# Patient Record
Sex: Female | Born: 1935 | Race: Black or African American | Hispanic: No | Smoking: Never smoker
Health system: Southern US, Community
[De-identification: ages and names within clinical notes are randomized; demographics above are authoritative.]

## PROBLEM LIST (undated history)

## (undated) DIAGNOSIS — E119 Type 2 diabetes mellitus without complications: Secondary | ICD-10-CM

## (undated) DIAGNOSIS — I10 Essential (primary) hypertension: Secondary | ICD-10-CM

## (undated) DIAGNOSIS — H548 Legal blindness, as defined in USA: Secondary | ICD-10-CM

## (undated) HISTORY — PX: MASTECTOMY: SHX3

---

## 2001-09-02 ENCOUNTER — Encounter: Payer: Self-pay | Admitting: General Surgery

## 2001-09-02 ENCOUNTER — Inpatient Hospital Stay (HOSPITAL_COMMUNITY): Admission: EM | Admit: 2001-09-02 | Discharge: 2001-09-04 | Payer: Self-pay | Admitting: Emergency Medicine

## 2001-09-02 ENCOUNTER — Encounter: Payer: Self-pay | Admitting: Emergency Medicine

## 2001-09-02 ENCOUNTER — Encounter (INDEPENDENT_AMBULATORY_CARE_PROVIDER_SITE_OTHER): Payer: Self-pay | Admitting: Specialist

## 2001-09-03 ENCOUNTER — Encounter: Payer: Self-pay | Admitting: General Surgery

## 2007-08-02 ENCOUNTER — Encounter: Admission: RE | Admit: 2007-08-02 | Discharge: 2007-08-02 | Payer: Self-pay | Admitting: General Surgery

## 2007-09-05 ENCOUNTER — Encounter (INDEPENDENT_AMBULATORY_CARE_PROVIDER_SITE_OTHER): Payer: Self-pay | Admitting: General Surgery

## 2007-09-05 ENCOUNTER — Ambulatory Visit (HOSPITAL_COMMUNITY): Admission: RE | Admit: 2007-09-05 | Discharge: 2007-09-06 | Payer: Self-pay | Admitting: General Surgery

## 2007-09-12 ENCOUNTER — Ambulatory Visit: Payer: Self-pay | Admitting: Oncology

## 2007-09-26 LAB — COMPREHENSIVE METABOLIC PANEL
AST: 13 U/L (ref 0–37)
Albumin: 3.9 g/dL (ref 3.5–5.2)
BUN: 21 mg/dL (ref 6–23)
Calcium: 9.1 mg/dL (ref 8.4–10.5)
Chloride: 98 mEq/L (ref 96–112)
Glucose, Bld: 242 mg/dL — ABNORMAL HIGH (ref 70–99)
Potassium: 4.5 mEq/L (ref 3.5–5.3)

## 2007-09-26 LAB — CBC WITH DIFFERENTIAL/PLATELET
Basophils Absolute: 0 10*3/uL (ref 0.0–0.1)
EOS%: 3.3 % (ref 0.0–7.0)
Eosinophils Absolute: 0.2 10*3/uL (ref 0.0–0.5)
HGB: 13.2 g/dL (ref 11.6–15.9)
NEUT#: 4.4 10*3/uL (ref 1.5–6.5)
RDW: 13.8 % (ref 11.3–14.5)
WBC: 6.5 10*3/uL (ref 3.9–10.0)
lymph#: 0.9 10*3/uL (ref 0.9–3.3)

## 2007-09-26 LAB — CANCER ANTIGEN 27.29: CA 27.29: 9 U/mL (ref 0–39)

## 2007-09-27 LAB — VITAMIN D 25 HYDROXY (VIT D DEFICIENCY, FRACTURES): Vit D, 25-Hydroxy: 72 ng/mL (ref 30–89)

## 2007-10-01 LAB — VITAMIN D 1,25 DIHYDROXY: Vit D, 1,25-Dihydroxy: 61 pg/mL (ref 6–62)

## 2007-12-19 ENCOUNTER — Ambulatory Visit: Payer: Self-pay | Admitting: Oncology

## 2008-01-17 LAB — COMPREHENSIVE METABOLIC PANEL
Albumin: 4 g/dL (ref 3.5–5.2)
BUN: 21 mg/dL (ref 6–23)
Calcium: 9.3 mg/dL (ref 8.4–10.5)
Chloride: 101 mEq/L (ref 96–112)
Glucose, Bld: 235 mg/dL — ABNORMAL HIGH (ref 70–99)
Potassium: 4.3 mEq/L (ref 3.5–5.3)

## 2008-01-17 LAB — CBC WITH DIFFERENTIAL/PLATELET
Basophils Absolute: 0 10*3/uL (ref 0.0–0.1)
Eosinophils Absolute: 0.1 10*3/uL (ref 0.0–0.5)
HCT: 37.3 % (ref 34.8–46.6)
HGB: 12.6 g/dL (ref 11.6–15.9)
MCV: 86.8 fL (ref 81.0–101.0)
NEUT#: 4.7 10*3/uL (ref 1.5–6.5)
RDW: 14 % (ref 11.3–14.5)
lymph#: 2 10*3/uL (ref 0.9–3.3)

## 2008-07-22 ENCOUNTER — Ambulatory Visit: Payer: Self-pay | Admitting: Oncology

## 2008-07-24 LAB — CBC WITH DIFFERENTIAL/PLATELET
Basophils Absolute: 0 10*3/uL (ref 0.0–0.1)
EOS%: 2.1 % (ref 0.0–7.0)
Eosinophils Absolute: 0.1 10*3/uL (ref 0.0–0.5)
HCT: 36.5 % (ref 34.8–46.6)
HGB: 12.3 g/dL (ref 11.6–15.9)
MCH: 29.4 pg (ref 26.0–34.0)
MCV: 87.1 fL (ref 81.0–101.0)
MONO%: 8.9 % (ref 0.0–13.0)
NEUT#: 4.5 10*3/uL (ref 1.5–6.5)
NEUT%: 65.1 % (ref 39.6–76.8)
Platelets: 332 10*3/uL (ref 145–400)

## 2008-07-25 LAB — COMPREHENSIVE METABOLIC PANEL
AST: 9 U/L (ref 0–37)
Albumin: 4 g/dL (ref 3.5–5.2)
Alkaline Phosphatase: 75 U/L (ref 39–117)
BUN: 18 mg/dL (ref 6–23)
Calcium: 9.3 mg/dL (ref 8.4–10.5)
Creatinine, Ser: 1.08 mg/dL (ref 0.40–1.20)
Glucose, Bld: 218 mg/dL — ABNORMAL HIGH (ref 70–99)
Potassium: 4.5 mEq/L (ref 3.5–5.3)

## 2008-07-25 LAB — VITAMIN D 25 HYDROXY (VIT D DEFICIENCY, FRACTURES): Vit D, 25-Hydroxy: 41 ng/mL (ref 30–89)

## 2008-07-25 LAB — LACTATE DEHYDROGENASE: LDH: 130 U/L (ref 94–250)

## 2009-01-07 ENCOUNTER — Ambulatory Visit: Payer: Self-pay | Admitting: Oncology

## 2009-01-09 LAB — CBC WITH DIFFERENTIAL/PLATELET
Basophils Absolute: 0 10*3/uL (ref 0.0–0.1)
EOS%: 1.8 % (ref 0.0–7.0)
Eosinophils Absolute: 0.1 10*3/uL (ref 0.0–0.5)
HGB: 12.4 g/dL (ref 11.6–15.9)
MCH: 29 pg (ref 25.1–34.0)
NEUT#: 3.6 10*3/uL (ref 1.5–6.5)
RDW: 14.5 % (ref 11.2–14.5)
lymph#: 1 10*3/uL (ref 0.9–3.3)

## 2009-01-12 LAB — COMPREHENSIVE METABOLIC PANEL
AST: 13 U/L (ref 0–37)
Albumin: 3.9 g/dL (ref 3.5–5.2)
BUN: 27 mg/dL — ABNORMAL HIGH (ref 6–23)
Calcium: 9.4 mg/dL (ref 8.4–10.5)
Chloride: 104 mEq/L (ref 96–112)
Potassium: 4.3 mEq/L (ref 3.5–5.3)
Total Protein: 6.8 g/dL (ref 6.0–8.3)

## 2009-01-12 LAB — VITAMIN D 25 HYDROXY (VIT D DEFICIENCY, FRACTURES): Vit D, 25-Hydroxy: 28 ng/mL — ABNORMAL LOW (ref 30–89)

## 2009-07-17 ENCOUNTER — Ambulatory Visit: Payer: Self-pay | Admitting: Oncology

## 2009-07-21 LAB — CBC WITH DIFFERENTIAL/PLATELET
Basophils Absolute: 0 10*3/uL (ref 0.0–0.1)
Eosinophils Absolute: 0.1 10*3/uL (ref 0.0–0.5)
HGB: 11.9 g/dL (ref 11.6–15.9)
MCV: 87.9 fL (ref 79.5–101.0)
MONO#: 0.6 10*3/uL (ref 0.1–0.9)
MONO%: 10.7 % (ref 0.0–14.0)
NEUT#: 3.8 10*3/uL (ref 1.5–6.5)
RDW: 15.2 % — ABNORMAL HIGH (ref 11.2–14.5)
WBC: 5.9 10*3/uL (ref 3.9–10.3)

## 2009-07-22 LAB — COMPREHENSIVE METABOLIC PANEL
Albumin: 3.7 g/dL (ref 3.5–5.2)
Alkaline Phosphatase: 84 U/L (ref 39–117)
BUN: 27 mg/dL — ABNORMAL HIGH (ref 6–23)
CO2: 23 mEq/L (ref 19–32)
Calcium: 9.1 mg/dL (ref 8.4–10.5)
Chloride: 104 mEq/L (ref 96–112)
Glucose, Bld: 221 mg/dL — ABNORMAL HIGH (ref 70–99)
Potassium: 4.4 mEq/L (ref 3.5–5.3)
Total Protein: 6 g/dL (ref 6.0–8.3)

## 2009-07-22 LAB — LACTATE DEHYDROGENASE: LDH: 102 U/L (ref 94–250)

## 2009-07-22 LAB — VITAMIN D 25 HYDROXY (VIT D DEFICIENCY, FRACTURES): Vit D, 25-Hydroxy: 34 ng/mL (ref 30–89)

## 2010-01-06 ENCOUNTER — Ambulatory Visit: Payer: Self-pay | Admitting: Oncology

## 2010-01-14 LAB — CBC WITH DIFFERENTIAL/PLATELET
BASO%: 0.3 % (ref 0.0–2.0)
Basophils Absolute: 0 10*3/uL (ref 0.0–0.1)
EOS%: 1.9 % (ref 0.0–7.0)
Eosinophils Absolute: 0.1 10*3/uL (ref 0.0–0.5)
HCT: 36.5 % (ref 34.8–46.6)
HGB: 12.4 g/dL (ref 11.6–15.9)
LYMPH%: 27.2 % (ref 14.0–49.7)
MCH: 30.2 pg (ref 25.1–34.0)
MCHC: 33.8 g/dL (ref 31.5–36.0)
MCV: 89.1 fL (ref 79.5–101.0)
MONO#: 0.5 10*3/uL (ref 0.1–0.9)
MONO%: 8.1 % (ref 0.0–14.0)
NEUT#: 4.2 10*3/uL (ref 1.5–6.5)
NEUT%: 62.5 % (ref 38.4–76.8)
Platelets: 365 10*3/uL (ref 145–400)
RBC: 4.1 10*6/uL (ref 3.70–5.45)
RDW: 13.8 % (ref 11.2–14.5)
WBC: 6.7 10*3/uL (ref 3.9–10.3)
lymph#: 1.8 10*3/uL (ref 0.9–3.3)

## 2010-01-15 LAB — COMPREHENSIVE METABOLIC PANEL
ALT: 9 U/L (ref 0–35)
AST: 11 U/L (ref 0–37)
Albumin: 4 g/dL (ref 3.5–5.2)
Alkaline Phosphatase: 75 U/L (ref 39–117)
BUN: 20 mg/dL (ref 6–23)
CO2: 23 mEq/L (ref 19–32)
Calcium: 9.1 mg/dL (ref 8.4–10.5)
Chloride: 102 mEq/L (ref 96–112)
Creatinine, Ser: 1.14 mg/dL (ref 0.40–1.20)
Glucose, Bld: 213 mg/dL — ABNORMAL HIGH (ref 70–99)
Potassium: 4.6 mEq/L (ref 3.5–5.3)
Sodium: 137 mEq/L (ref 135–145)
Total Bilirubin: 0.4 mg/dL (ref 0.3–1.2)
Total Protein: 6.5 g/dL (ref 6.0–8.3)

## 2010-01-15 LAB — CANCER ANTIGEN 27.29: CA 27.29: 13 U/mL (ref 0–39)

## 2010-01-15 LAB — VITAMIN D 25 HYDROXY (VIT D DEFICIENCY, FRACTURES): Vit D, 25-Hydroxy: 34 ng/mL (ref 30–89)

## 2010-07-09 ENCOUNTER — Ambulatory Visit: Payer: Self-pay | Admitting: Oncology

## 2010-07-16 LAB — CBC WITH DIFFERENTIAL/PLATELET
BASO%: 0.6 % (ref 0.0–2.0)
Basophils Absolute: 0 10*3/uL (ref 0.0–0.1)
EOS%: 1.8 % (ref 0.0–7.0)
HCT: 37 % (ref 34.8–46.6)
HGB: 12.5 g/dL (ref 11.6–15.9)
MCH: 29.6 pg (ref 25.1–34.0)
MONO#: 0.7 10*3/uL (ref 0.1–0.9)
RDW: 14.4 % (ref 11.2–14.5)
WBC: 6.7 10*3/uL (ref 3.9–10.3)
lymph#: 1.7 10*3/uL (ref 0.9–3.3)

## 2010-07-16 LAB — COMPREHENSIVE METABOLIC PANEL
ALT: 14 U/L (ref 0–35)
AST: 14 U/L (ref 0–37)
Albumin: 4.1 g/dL (ref 3.5–5.2)
BUN: 30 mg/dL — ABNORMAL HIGH (ref 6–23)
CO2: 25 mEq/L (ref 19–32)
Calcium: 9.5 mg/dL (ref 8.4–10.5)
Chloride: 105 mEq/L (ref 96–112)
Potassium: 4.4 mEq/L (ref 3.5–5.3)

## 2010-07-16 LAB — LACTATE DEHYDROGENASE: LDH: 120 U/L (ref 94–250)

## 2010-08-25 ENCOUNTER — Ambulatory Visit (HOSPITAL_BASED_OUTPATIENT_CLINIC_OR_DEPARTMENT_OTHER): Payer: Medicare Other | Admitting: Oncology

## 2011-01-04 NOTE — Op Note (Signed)
Debbie Singleton, Debbie Singleton               ACCOUNT NO.:  [id number]   MEDICAL RECORD NO.:  [id number]          PATIENT TYPE:  AMB   LOCATION:  SDS                          FACILITY:  [facility]   PHYSICIAN:  Angelia Mould. Derrell Lolling, M.D.DATE OF BIRTH:  07/19/1926   DATE OF PROCEDURE:  09/05/2007  DATE OF DISCHARGE:                               OPERATIVE REPORT   PREOPERATIVE DIAGNOSIS:  Extensive ductal carcinoma in situ, left  breast.   POSTOPERATIVE DIAGNOSIS:  Extensive ductal carcinoma in situ, left  breast.   OPERATION PERFORMED:  1. Injection blue dye left breast.  2. Left total mastectomy.  3. Sentinel node biopsy.   SURGEON:  Angelia Mould. Derrell Lolling, M.D.   ASSISTANT:  Currie Paris, M.D.   OPERATIVE INDICATIONS:  This is a 77 year old black female who felt a  lump in her breast for about 3 months and went for x-rays.  She has had  mammograms, ultrasound, BSGI and MRI.  What is found is about a 7 cm  mass in the left breast in the upper inner quadrant.  This has lobulated  margins.  Endoscopic biopsy showed intermediate grade ductal carcinoma  in situ.  We discussed her options.  Decision was made to have a left  total mastectomy and left sentinel node biopsy.  She is brought to the  hospital electively.   OPERATIVE TECHNIQUE:  The patient underwent injection of 1 mCi of  technetium sulfur colloid by the nuclear medicine technician in the  holding area of the OR.  She was then taken to operating room.  General  anesthesia was induced.  Intravenous antibiotics were given.  The  patient was identified as the correct patient, correct procedure and  correct site.  Following alcohol prep,  I injected 5 mL of blue dye  behind the right nipple.  This was 2 mL of methylene blue and 3 mL of  saline.  We massaged the breast for about 5 minutes.  The neck and  chest, left breast, axilla, and left lateral chest wall were then  prepped and draped in sterile fashion.   I carefully marked  transverse elliptical incision so as to remove  adequate amounts of skin and avoid skin redundancy.  This transverse  elliptical incision was made with a knife.  Skin flaps were raised  superiorly to just below the clavicle, medially to the parasternal area,  inferiorly down to the rectus sheath, and laterally to latissimus dorsi  muscle.  Using the NeoProbe, we dissected out a single hot blue lymph  node that was probably 150 to 200 times background.  This was the only  lymph node that we could find that had radioactivity.  We sent this to  the lab to Dr. Colonel Bald who said that imprint cytology was negative for  cancer cells.  There was some other enlarged lymph nodes so I excised a  small area of these lymph nodes for routine histology.  This was mostly  a small area of level I lymph nodes.  The breast was then dissected away  from the pectoralis major muscle with electrocautery, taking the  breast  and pectoralis fascia within the bleeding.  All of the pectoralis major  and minor muscle intact.  The tail of Mliss Sax was marked with a suture.  The breast was sent the lab for routine histology.  The wound was  irrigated copiously with saline.  Hemostasis was excellent and achieved  electrocautery.  Two 19-French Blake drains were placed, one up into the  left axillary space and one across the skin flaps.  These were brought  out through separate stab incisions inferolaterally, sutured to the skin  with nylon suture and connected to suction bulbs.  Skin was closed  with skin staples.  Kling bandages placed with Adaptic gauze, 4x4s, ABD  pads, and 6 inch Ace  wraps.  She tolerated the procedure well and was  taken to the recovery room  in stable condition.  Estimated blood loss  was about 75-100 mL.  There were no complications.  Sponge, needle and  instrument counts were correct.      Angelia Mould. Derrell Lolling, M.D.  Electronically Signed     HMI/MEDQ  D:  09/05/2007  T:  09/05/2007  Job:   [id number]   cc:   Merlene Laughter. Renae Gloss, M.D.

## 2011-01-07 NOTE — H&P (Signed)
[facility]  Patient:    Debbie Singleton, Debbie Singleton Visit Number: [id number] MRN: [id number]          Service Type: EMS Location: ED Attending Physician:  Donnetta Hutching Dictated by:   Angelia Mould. Derrell Lolling, M.D. Admit Date:  09/02/2001   CC:         Geraldo Pitter, M.D.   History and Physical  CHIEF COMPLAINT:  Abdominal pain and vomiting.  HISTORY OF PRESENT ILLNESS:  This is a 77 year old black female with type 2 diabetes.  She presents with a 36-hour history of epigastric pain, right upper quadrant pain, and right back pain.  She vomited three times.  Her bowel movements have been slightly softer than usual, but no diarrhea or significant constipation.  She has seen no blood in her stool.  She saw Harrel Lemon. Merla Riches, M.D., at [facility] yesterday and was thought to have "food poisoning."  She was given Phenergan.  The pain and nausea persisted and she came to the [facility] Emergency Room today and was evaluated by the emergency department physician and was found to have gallstones.  I was called to see her at that point.  The patient states that she has had mild epigastric discomfort in the past, but denies relationship to meals and denies fatty food intolerance.  She denies a history of liver disease, jaundice, or hepatitis.  She denies a history of ulcer disease, diverticulitis, or appendicitis.  She has no heart or pulmonary problems.  She is admitted for management of her acute cholecystitis.  PAST MEDICAL HISTORY:  She has had six pregnancies and six vaginal deliveries. She had a bilateral tubal ligation and possibly an appendectomy after the last pregnancy.  She has type 2 diabetes.  She has borderline hypertension.  She states that she has problems with her eyesight related to glaucoma.  She has tried lots of different drops, was allergic to all, and is not taking any now. She is followed by Remi Deter L. Chales Abrahams, M.D., who is her  ophthalmologist.  CURRENT MEDICATIONS:  Glynase one tablet daily.  DRUG ALLERGIES:  None known.  SOCIAL HISTORY:  The patient lives in [place], [place], with her daughter.  She denies the use of alcohol or tobacco.  FAMILY HISTORY:  Her mother died in her 30s of hypertension and cerebral hemorrhage.  Her father died in his 60s of sudden death.  She had four sisters and two brothers, three have died, but she does not know why.  REVIEW OF SYSTEMS:  All systems are reviewed and are noncontributory, except as described above.  PHYSICAL EXAMINATION:  A pleasant, somewhat overweight, black female in minimal distress.  She has received some parenteral analgesics and is slightly sedated, but is cooperative and conversant and oriented.  VITAL SIGNS:  Temperature 97.1 degrees, pulse 68 and regular, respirations 18, blood pressure 179/60.  HEENT:  The sclerae are clear.  Extraocular movements are intact.  She has very poor eyesight.  She cannot track my fingers and cannot count fingers. She does have good light perception and states that she can see body shapes and movement.  Oropharynx clear.  NECK:  Supple.  Nontender.  No mass.  No bruit.  No jugular venous distention. No adenopathy.  No tenderness.  No crepitants.  LUNGS:  Clear to auscultation.  No CVA tenderness.  HEART:  Regular rate and rhythm.  No murmurs.  ABDOMEN:  Tender in the right upper quadrant, but no guarding, no rebound, and  no mass.  She is not distended.  Bowel sounds are hypoactive.  She has a well-healed lower midline scar, presumably from her tubal ligation.  She is somewhat obese.  EXTREMITIES:  No edema.  Good pulses in the feet.  NEUROLOGIC:  Grossly within normal limits.  ADMISSION LABORATORY DATA:  Ultrasound shows gallstones, thickening of the gallbladder wall, and the common bile duct measures 7.0 mm.  Serum amylase 43.  Liver function tests normal.  Glucose 237. Hemoglobin 13.8, white  blood cell count 8400.  IMPRESSION: 1. Acute cholecystitis with cholelithiasis. 2. Type 2 diabetes mellitus. 3. Borderline hypertension. 4. Severe visual impairment reportedly secondary to glaucoma.  PLAN: 1. The patient will be admitted to the hospital and started on IV antibiotics    and bowel rest. 2. We will place her on a perioperative diabetic sliding scale insulin    protocol. 3. She will undergo cholecystectomy tomorrow.  I have explained the indications and details of surgery to the patient and her daughter.  The risks and complications have been discussed, including, but not limited to bleeding, infection, conversion to open laparotomy, bile duct injury, injury to adjacent organs, wound problems such as hernia or infection, and other unforeseen problems.  She seems to understand all of these issues well and at this time all of her questions were answered.  She agreed to this plan. Dictated by:   Angelia Mould. Derrell Lolling, M.D. Attending Physician:  Donnetta Hutching DD:  09/02/01 TD:  09/02/01 Job: [id number]

## 2011-03-09 ENCOUNTER — Encounter: Payer: Medicare Other | Admitting: Oncology

## 2011-03-09 DIAGNOSIS — Z170 Estrogen receptor positive status [ER+]: Secondary | ICD-10-CM

## 2011-03-09 DIAGNOSIS — C50219 Malignant neoplasm of upper-inner quadrant of unspecified female breast: Secondary | ICD-10-CM

## 2011-03-09 LAB — CBC WITH DIFFERENTIAL/PLATELET
Basophils Absolute: 0 10*3/uL (ref 0.0–0.1)
EOS%: 1.9 % (ref 0.0–7.0)
HCT: 42 % (ref 34.8–46.6)
HGB: 13.9 g/dL (ref 11.6–15.9)
MCH: 28.8 pg (ref 25.1–34.0)
MCV: 86.9 fL (ref 79.5–101.0)
MONO%: 9.3 % (ref 0.0–14.0)
NEUT%: 55.5 % (ref 38.4–76.8)
Platelets: 340 10*3/uL (ref 145–400)

## 2011-03-09 LAB — COMPREHENSIVE METABOLIC PANEL
AST: 15 U/L (ref 0–37)
Alkaline Phosphatase: 111 U/L (ref 39–117)
BUN: 22 mg/dL (ref 6–23)
Calcium: 10.1 mg/dL (ref 8.4–10.5)
Creatinine, Ser: 1.1 mg/dL (ref 0.50–1.10)

## 2011-03-21 ENCOUNTER — Encounter (HOSPITAL_BASED_OUTPATIENT_CLINIC_OR_DEPARTMENT_OTHER): Payer: Medicare Other | Admitting: Oncology

## 2011-03-21 DIAGNOSIS — Z170 Estrogen receptor positive status [ER+]: Secondary | ICD-10-CM

## 2011-03-21 DIAGNOSIS — C50219 Malignant neoplasm of upper-inner quadrant of unspecified female breast: Secondary | ICD-10-CM

## 2011-05-12 LAB — BASIC METABOLIC PANEL
Calcium: 9.8
GFR calc Af Amer: >60
GFR calc non Af Amer: 52 — ABNORMAL LOW
Glucose, Bld: 159 — ABNORMAL HIGH
Potassium: 4.1
Sodium: 132 — ABNORMAL LOW

## 2011-05-12 LAB — URINALYSIS, ROUTINE W REFLEX MICROSCOPIC
Glucose, UA: NEGATIVE
Hgb urine dipstick: NEGATIVE
Ketones, ur: NEGATIVE
Protein, ur: NEGATIVE

## 2011-05-12 LAB — CBC
HCT: 39.6
Hemoglobin: 13.4
RDW: 14.5
WBC: 7.5

## 2011-05-12 LAB — DIFFERENTIAL
Basophils Absolute: 0.1
Lymphocytes Relative: 28
Lymphs Abs: 2.1
Monocytes Absolute: 0.7
Neutro Abs: 4.5

## 2011-05-12 LAB — URINE MICROSCOPIC-ADD ON

## 2011-07-28 ENCOUNTER — Telehealth: Payer: Self-pay | Admitting: *Deleted

## 2011-07-28 NOTE — Telephone Encounter (Signed)
called patient to inform the patient of the new date and time the patient's phone line had been disconnected mailed out letter and calendar to inform the patient of the new date and time

## 2011-10-11 ENCOUNTER — Other Ambulatory Visit: Payer: Medicare Other | Admitting: Lab

## 2011-10-14 ENCOUNTER — Telehealth: Payer: Self-pay | Admitting: Oncology

## 2011-10-14 NOTE — Telephone Encounter (Signed)
Unable to contact theb phone by telephone. Phone has been disconnected

## 2011-10-19 ENCOUNTER — Ambulatory Visit: Payer: Medicare Other | Admitting: Physician Assistant

## 2011-10-19 ENCOUNTER — Ambulatory Visit: Payer: Medicare Other | Admitting: Oncology

## 2013-10-09 LAB — CBC WITH DIFFERENTIAL/PLATELET
BASOS ABS: 0 10*3/uL (ref 0.0–0.1)
Basophil %: 0.6 %
Eosinophil #: 0.1 10*3/uL (ref 0.0–0.7)
Eosinophil %: 0.7 %
HCT: 34.6 % — ABNORMAL LOW (ref 35.0–47.0)
HGB: 11.9 g/dL — AB (ref 12.0–16.0)
Lymphocyte #: 1.1 10*3/uL (ref 1.0–3.6)
Lymphocyte %: 14.7 %
MCH: 30.6 pg (ref 26.0–34.0)
MCHC: 34.4 g/dL (ref 32.0–36.0)
MCV: 89 fL (ref 80–100)
Monocyte #: 0.7 x10 3/mm (ref 0.2–0.9)
Monocyte %: 8.5 %
NEUTROS ABS: 5.8 10*3/uL (ref 1.4–6.5)
Neutrophil %: 75.5 %
Platelet: 348 10*3/uL (ref 150–440)
RBC: 3.9 10*6/uL (ref 3.80–5.20)
RDW: 14.3 % (ref 11.5–14.5)
WBC: 7.7 10*3/uL (ref 3.6–11.0)

## 2013-10-10 ENCOUNTER — Inpatient Hospital Stay: Payer: Self-pay | Admitting: Internal Medicine

## 2013-10-10 LAB — OSMOLALITY, URINE: OSMOLALITY: 495 mosm/kg

## 2013-10-10 LAB — URINALYSIS, COMPLETE
BACTERIA: NONE SEEN
Bilirubin,UR: NEGATIVE
Blood: NEGATIVE
Glucose,UR: >=500 mg/dL (ref 0–75)
LEUKOCYTE ESTERASE: NEGATIVE
Nitrite: NEGATIVE
Ph: 5 (ref 4.5–8.0)
Protein: NEGATIVE
Specific Gravity: 1.011 (ref 1.003–1.030)
Squamous Epithelial: <1
WBC UR: 1 /HPF (ref 0–5)

## 2013-10-10 LAB — BASIC METABOLIC PANEL
Anion Gap: 9 (ref 7–16)
BUN: 14 mg/dL (ref 7–18)
CHLORIDE: 86 mmol/L — AB (ref 98–107)
CREATININE: 1.02 mg/dL (ref 0.60–1.30)
Calcium, Total: 8.5 mg/dL (ref 8.5–10.1)
Co2: 22 mmol/L (ref 21–32)
EGFR (African American): 58 — ABNORMAL LOW
EGFR (Non-African Amer.): 50 — ABNORMAL LOW
Glucose: 231 mg/dL — ABNORMAL HIGH (ref 65–99)
Osmolality: 244 (ref 275–301)
Potassium: 4.1 mmol/L (ref 3.5–5.1)
Sodium: 117 mmol/L — CL (ref 136–145)

## 2013-10-10 LAB — SODIUM, URINE, RANDOM: SODIUM, URINE RANDOM: 102 mmol/L (ref 20–110)

## 2013-10-10 LAB — SEDIMENTATION RATE: Erythrocyte Sed Rate: 21 mm/hr (ref 0–30)

## 2013-10-10 LAB — OSMOLALITY: Osmolality: 249 mOsm/kg — CL (ref 280–301)

## 2013-10-10 LAB — TROPONIN I: Troponin-I: <0.02 ng/mL

## 2013-10-11 LAB — BASIC METABOLIC PANEL
ANION GAP: 6 — AB (ref 7–16)
BUN: 14 mg/dL (ref 7–18)
CALCIUM: 8 mg/dL — AB (ref 8.5–10.1)
Chloride: 106 mmol/L (ref 98–107)
Co2: 23 mmol/L (ref 21–32)
Creatinine: 0.92 mg/dL (ref 0.60–1.30)
EGFR (African American): >60
GFR CALC NON AF AMER: 56 — AB
Glucose: 129 mg/dL — ABNORMAL HIGH (ref 65–99)
OSMOLALITY: 272 (ref 275–301)
POTASSIUM: 4.1 mmol/L (ref 3.5–5.1)
Sodium: 135 mmol/L — ABNORMAL LOW (ref 136–145)

## 2013-10-11 LAB — LIPID PANEL
CHOLESTEROL: 144 mg/dL (ref 0–200)
HDL: 51 mg/dL (ref 40–60)
LDL CHOLESTEROL, CALC: 75 mg/dL (ref 0–100)
TRIGLYCERIDES: 91 mg/dL (ref 0–200)
VLDL Cholesterol, Calc: 18 mg/dL (ref 5–40)

## 2013-10-11 LAB — TSH: Thyroid Stimulating Horm: 4.32 u[IU]/mL

## 2014-10-09 DIAGNOSIS — E559 Vitamin D deficiency, unspecified: Secondary | ICD-10-CM | POA: Diagnosis not present

## 2014-10-09 DIAGNOSIS — I129 Hypertensive chronic kidney disease with stage 1 through stage 4 chronic kidney disease, or unspecified chronic kidney disease: Secondary | ICD-10-CM | POA: Diagnosis not present

## 2014-10-09 DIAGNOSIS — E1122 Type 2 diabetes mellitus with diabetic chronic kidney disease: Secondary | ICD-10-CM | POA: Diagnosis not present

## 2014-12-13 NOTE — Discharge Summary (Signed)
PATIENT NAME:  Debbie Singleton, Debbie Singleton MR#:  [id number] DATE OF BIRTH:  11/08/1924  DATE OF ADMISSION:  10/10/2013 DATE OF DISCHARGE:  10/11/2013  PRESENTING COMPLAINT: Loss of vision.   DISCHARGE DIAGNOSES: 1.  Progressive loss of vision, acute on chronic, due to combination of glaucoma and acuity degeneration.  2.  Hyponatremia due to blood pressure meds and poor p.o. intake, resolved.  3.  Type 2 diabetes.  4.  Hypertension.   CODE STATUS: Full code.   DISCHARGE MEDICATIONS: 1.  Atenolol 25 mg p.o. daily.  2.  Latanoprost 0.005% ophthalmic drops 1 drop to affected eyes at bedtime.  3.  Glimepiride 2 mg daily.  4.  Azilsartan 40 mg p.o. daily.  5.  The patient advised not to stop taking edarbychlor.   FOLLOWUP: With Dr. George Ina at [facility] in 2 to 4 weeks. Follow up with your primary care physician in 1 to 2 weeks.   LABORATORY, DIAGNOSTIC AND RADIOLOGICAL DATA:   1.  Sodium on admission was 117, at discharge it was 135. Lipid profile within normal limits. TSH within normal limits.  2.  Echo of the heart showed EF of 55% to 60%, no cardiac thrombi seen. Mild-to-moderate tricuspid regurgitation, mild to moderate aortic regurgitation.  3.  UA negative for UTI. 4.  CBC within normal limits except H and H of 11.9 and 34.6.   CONSULTATIONS:  Ophthalmology consultation with Dr. George Ina.   BRIEF SUMMARY OF HOSPITAL COURSE: Debbie Singleton is an 81 year old African American female with history of glaucoma and macular degeneration and loss of vision and history of hypertension, who comes in with:  1.  Progressive increasing loss of vision. The patient seems to have an underlying diagnosis of glaucoma and macular degeneration per ophthalmology eval. No further recommendations were given by Dr. George Ina other than follow up with [facility] in 1 to 2 months. The patient seems to have this as a chronic issue. She has not been using her eye drops as directed and has been using her  homemade eyedrops which she made from distilled water, honey and vinegar. The patient was advised to stop using her homemade eyedrops and asked her to resume her eyedrops prescribed by the ophthalmologist.  2.  Hyponatremia secondary to low salt diet which the patient follows and blood pressure meds, which is ARB with chlorthalidone that was recently started in January. The patient received IV fluids. Her sodium is back to 135. She is asymptomatic.  3.  Type 2 diabetes. Continue glimepiride.   Overall hospital stay remained stable. The patient will be discharged home with her daughter.   TIME SPENT: 40 minutes.   ____________________________ Hart Rochester Posey Pronto, MD sap:cs D: 10/13/2013 07:20:00 ET T: 10/13/2013 14:28:01 ET JOB#: [id number]  cc: Jvon Meroney A. Posey Pronto, MD, <Dictator> Livingston Diones. Porfilio, MD Ilda Basset MD ELECTRONICALLY SIGNED 10/20/2013 13:38

## 2014-12-13 NOTE — H&P (Signed)
PATIENT NAME:  Debbie Singleton, ZEE MR#:  [id number] DATE OF BIRTH:  Jun 04, 1925  DATE OF ADMISSION:  10/10/2013  PRIMARY CARE PHYSICIAN:  Nonlocal.   REFERRING PHYSICIAN:  Dr. Owens Shark.   CHIEF COMPLAINT:  Loss of vision in the left eye.   HISTORY OF PRESENT ILLNESS:  Debbie Singleton is an 81 year old female who is an extremely poor historian who comes to the Emergency Department with complaints of loss of vision in the left eye.  The patient follows up with an ophthalmologist in [place].  Per the patient, the patient has been having high pressures in her eye, seems to be like a glaucoma.  However, the patient does not use the eye drops prescribed by the ophthalmologist.  The patient uses her won eye drops prepared by her with vinegar, honey and distilled water.  The patient put her eye drops around 4 in the afternoon with sudden loss of vision.  At baseline the patient only can see shapes.  Currently the patient states only can see some light.  Concerning this, the patient is brought to the Emergency Department.  Work-up in the Emergency Department, CT head which was unremarkable.  Sodium level was 117.  The patient does not remember what medications she takes for her blood pressure.  States she takes a pill with a combination of two medications.  Denies having any headache.  Denies having any pain in the eye.  Denies having any watery eyes.  Discussed with ophthalmology by the Emergency Department physician who recommended the patient to come and follow up as an outpatient.    PAST MEDICAL HISTORY:   1.  Per patient, diabetes mellitus.  2.  Hypertension.   PAST SURGICAL HISTORY:  1.  Tubal ligation.  2.  Cholecystectomy.  3.  Left breast mastectomy.   ALLERGIES:  No known drug allergies.   HOME MEDICATIONS:  The patient is unable to recall.   SOCIAL HISTORY:  Denies smoking, drinking alcohol or using illicit drugs.  Lives with her grandson.   FAMILY HISTORY:  Per patient, no obvious health problems  run in the family.   REVIEW OF SYSTEMS: CONSTITUTIONAL:  Experiences generalized weakness.  EYES:  Has blurred vision which is her baseline.  EARS, NOSE, THROAT:  No change in hearing.  RESPIRATORY:  Has no cough, shortness of breath.  CARDIOVASCULAR:  No chest pain, palpitations.  GASTROINTESTINAL:  No nausea, vomiting, abdominal pain. GENITOURINARY:  No dysuria or hematuria.  ENDOCRINE:  Has diagnosis of diabetes mellitus.  HEMATOLOGY:  No easy bruising or bleeding.  SKIN:  No rash or lesions.  MUSCULOSKELETAL:  No joint pains.  NEUROLOGIC:  No weakness or numbness in any part of the body.   PHYSICAL EXAMINATION:  GENERAL:  This is a well-built, well-nourished, age-appropriate female lying down in the bed, not in distress.  VITAL SIGNS:  Temperature is 98.7, pulse 73, blood pressure 117/71, respiratory rate of 20, oxygen saturation 100% on room air.  HEENT:  Head normocephalic, atraumatic.  Eyes, no scleral icterus.  Conjunctivae normal.  Pupils unequal, right has irregular pupil, poor response to the light.  Mucous membranes moist.  NECK:  Supple.  No lymphadenopathy.  No JVD.  No carotid bruit.  CHEST:  Has no focal tenderness.   LUNGS:  Bilateral clear to auscultation.  HEART:  S1, S2 regular.  No murmurs are heard.  ABDOMEN:  Bowel sounds plus.  Soft, nontender, nondistended.  No hepatosplenomegaly.  EXTREMITIES:  No pedal edema.  Pulses 2+.  NEUROLOGIC:  The patient is alert, oriented to place, person and time.  Cranial nerves II through XII intact.  Motor 5 by 5 in upper and lower extremities.   LABORATORY DATA:  CBC is completely within normal limits.  CMP, glucose 231, sodium 117, the rest of all the values are within normal limits.  CT head without contrast:  No acute intracranial abnormality.  The troponin less than 0.02.  EKG, 12-lead:  Normal sinus rhythm.  No ST-T wave abnormalities.   ASSESSMENT AND PLAN:  Debbie Singleton is an 81 year old female who comes to the Emergency  Department with sudden loss of vision in the left eye.  1.  Acute loss of vision.  Differential diagnosis, the patient seems to have underlying diagnosis of glaucoma, central retinal artery or venous occlusion.  Consult ophthalmology in the morning, concerning about the patient's complex history.  The patient seems to be having this chronic issue and has been noncompliant with her medications given by her ophthalmologist.  2.  Hyponatremia.  The patient states takes a pill with a combination of pills, most likely hydrochlorothiazide, however ask the patient's family to bring the medications.  Continue with IV fluids.  Check the urine sodium, urine and serum osmolality.  Follow up with a BMP q. 12 hours.  3.  Diabetes mellitus.  Keep the patient on Accu-Cheks and sliding scale insulin.  4.  Hypertension, currently well-controlled.  We will hold all the blood pressure medications.  5.  Keep the patient on deep vein thrombosis prophylaxis with Lovenox.   TIME SPENT:  50 minutes.    ____________________________ Monica Becton, MD pv:ea D: 10/10/2013 25:63:89 ET T: 10/10/2013 02:34:28 ET JOB#: [id number]  cc: Monica Becton, MD, <Dictator> Monica Becton MD ELECTRONICALLY SIGNED 10/24/2013 1:23

## 2015-02-08 IMAGING — CT CT HEAD WITHOUT CONTRAST
1 series · 16 of 30 positions shown, 20 images · non-contrast
Comparison: None.

CLINICAL DATA: Vision loss, nausea, vomiting.

EXAM:
CT HEAD WITHOUT CONTRAST
TECHNIQUE: Contiguous axial images were obtained from the base of the skull
through the vertex without intravenous contrast.

[Series 2: head wo · axial · 0.39mm/px · z∈[-45,+81]mm · 16 of 32 slices shown, 20 images]
[im 2/32  brain]
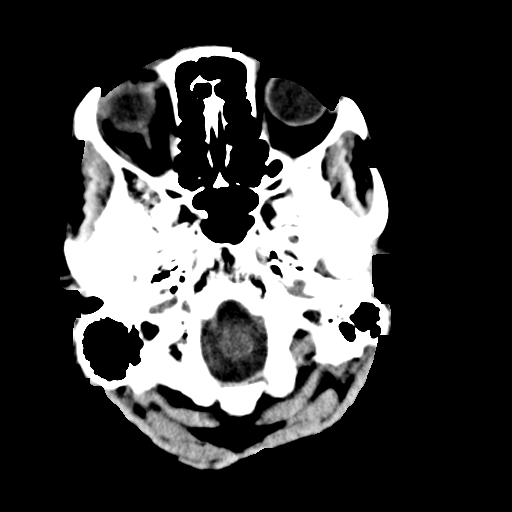
[im 2/32  bone]
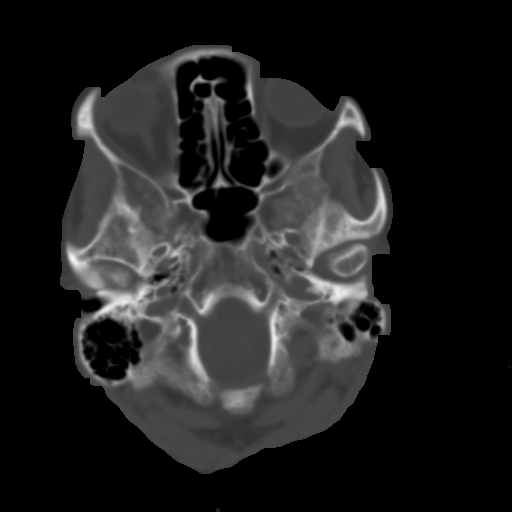
[im 4/32  brain]
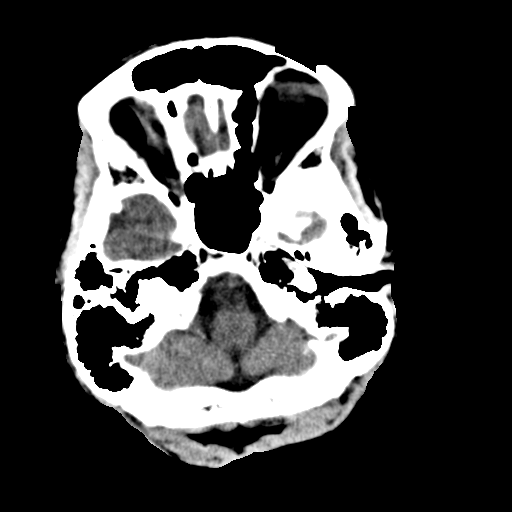
[im 6/32  brain]
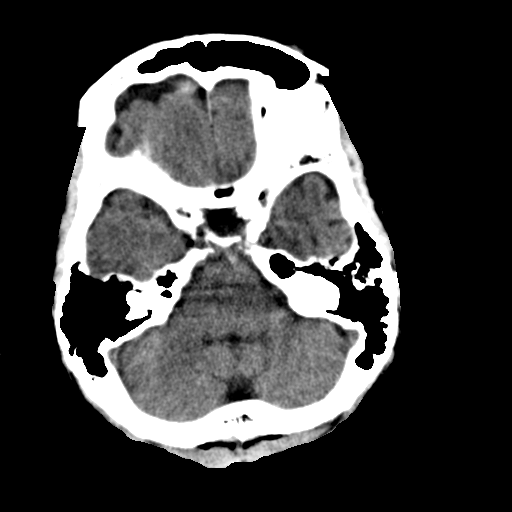
[im 8/32  brain]
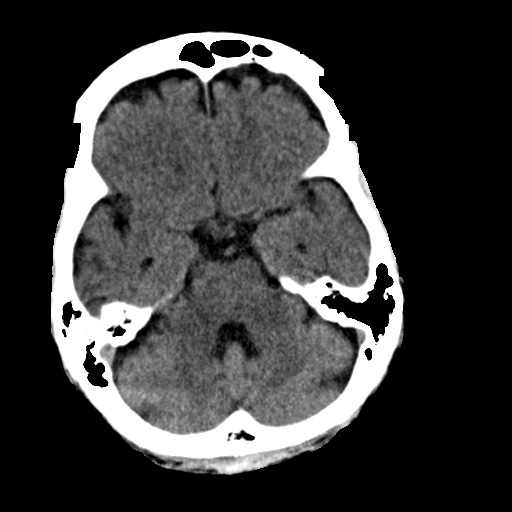
[im 9/32  brain]
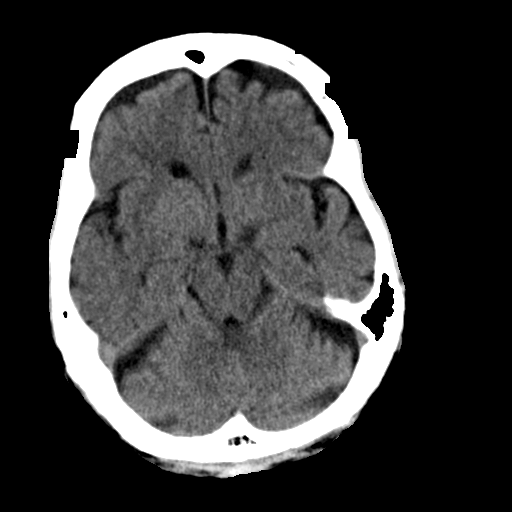
[im 9/32  bone]
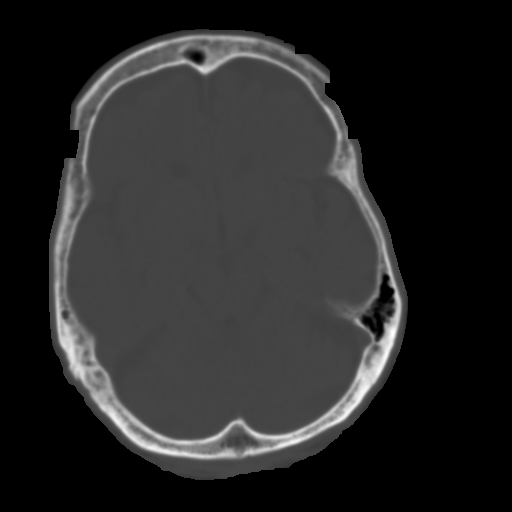
[im 11/32  brain]
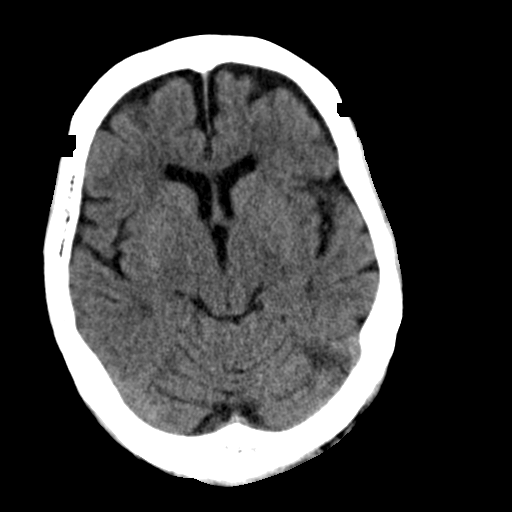
[im 13/32  brain]
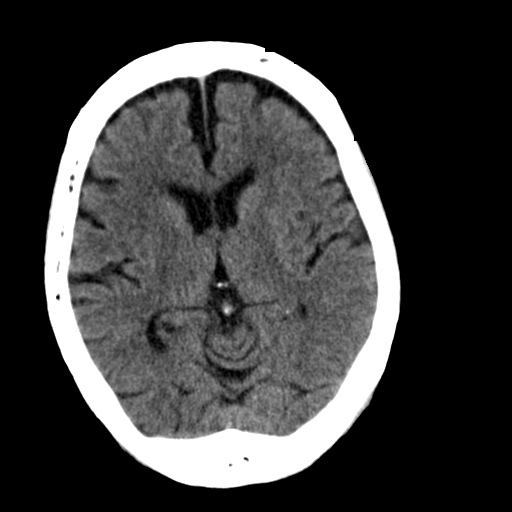
[im 15/32  brain]
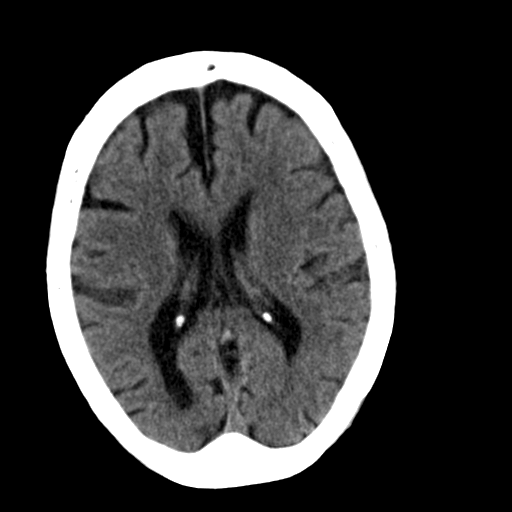
[im 17/32  brain]
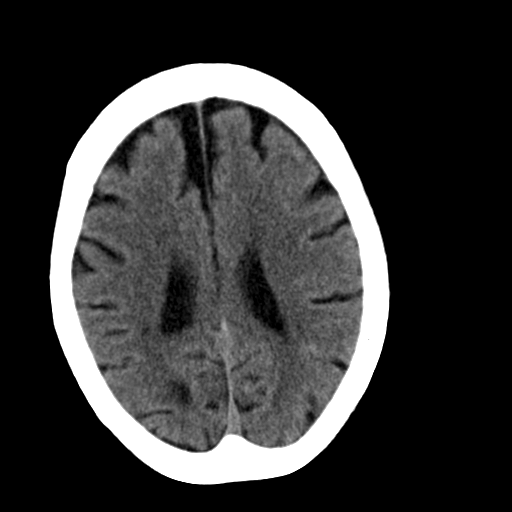
[im 17/32  bone]
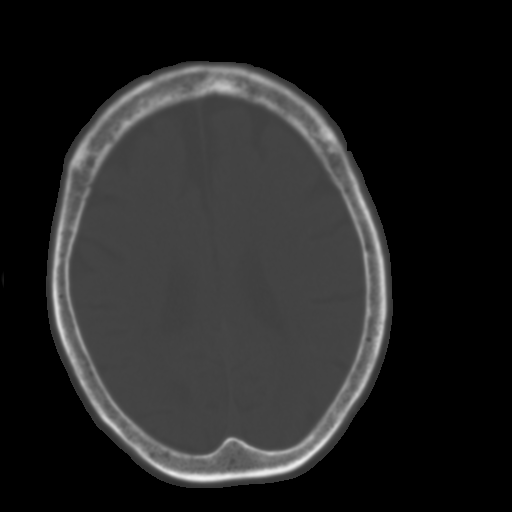
[im 19/32  brain]
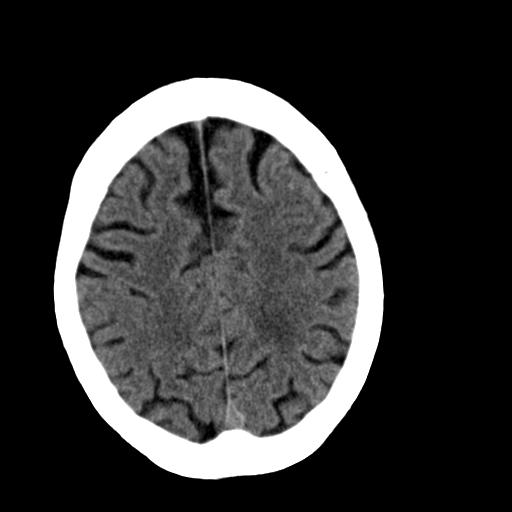
[im 21/32  brain]
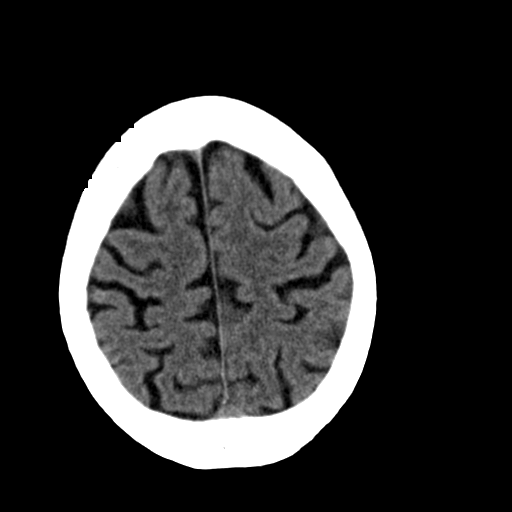
[im 23/32  brain]
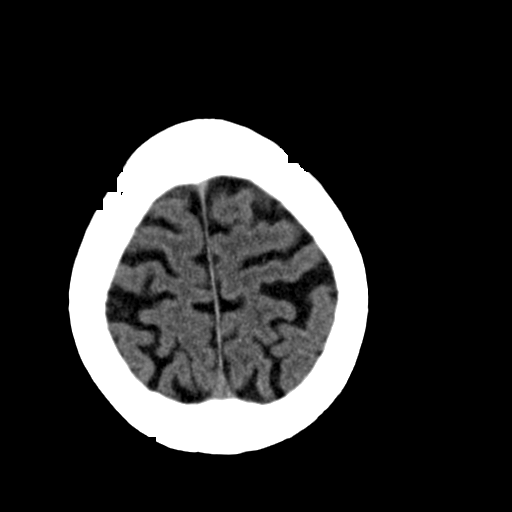
[im 24/32  brain]
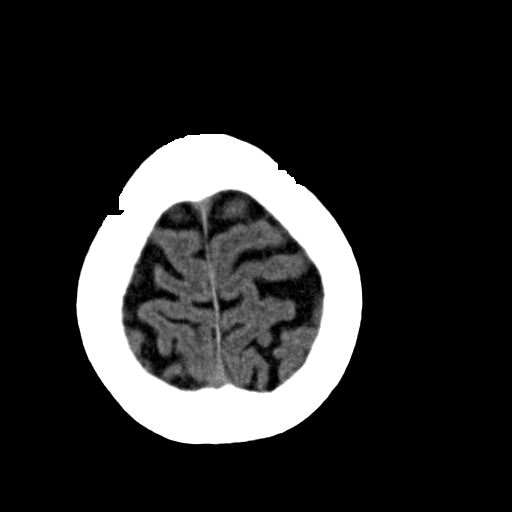
[im 24/32  bone]
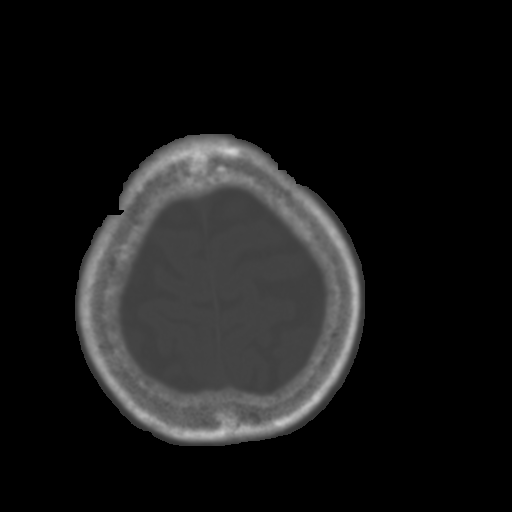
[im 26/32  brain]
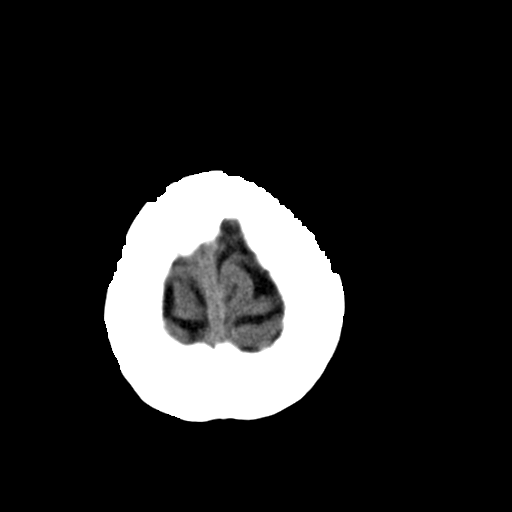
[im 28/32  brain]
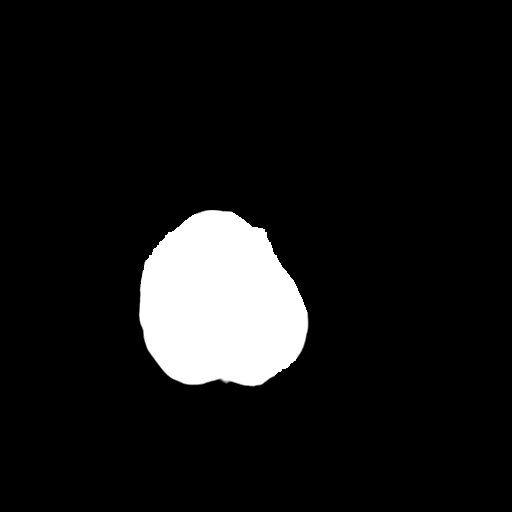
[im 30/32  brain]
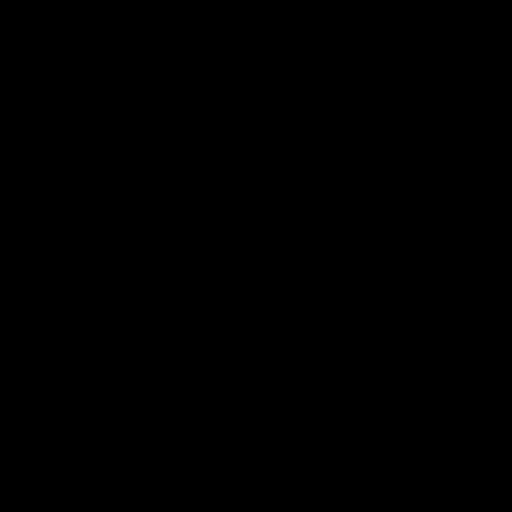

[16 of 30 positions shown; findings below may reference images not displayed]

FINDINGS: Mild age related volume loss. Minimal chronic microvascular changes
throughout the deep white matter. No acute intracranial abnormality.
Specifically, no hemorrhage, hydrocephalus, mass lesion, acute
infarction, or significant intracranial injury. No acute calvarial
abnormality. Visualized paranasal sinuses and mastoids clear.
Orbital soft tissues unremarkable.
IMPRESSION: No acute intracranial abnormality.

## 2018-10-03 DIAGNOSIS — R600 Localized edema: Secondary | ICD-10-CM | POA: Diagnosis present

## 2018-10-16 DIAGNOSIS — I10 Essential (primary) hypertension: Secondary | ICD-10-CM | POA: Diagnosis present

## 2018-10-16 DIAGNOSIS — E119 Type 2 diabetes mellitus without complications: Secondary | ICD-10-CM

## 2020-08-05 ENCOUNTER — Emergency Department: Payer: Medicare Other

## 2020-08-05 ENCOUNTER — Other Ambulatory Visit: Payer: Self-pay

## 2020-08-05 ENCOUNTER — Observation Stay
Admission: EM | Admit: 2020-08-05 | Discharge: 2020-08-07 | Disposition: A | Discharge status: Home / Self Care | Payer: Medicare Other | Attending: Internal Medicine | Admitting: Internal Medicine

## 2020-08-05 ENCOUNTER — Encounter: Payer: Self-pay | Admitting: Intensive Care

## 2020-08-05 DIAGNOSIS — R600 Localized edema: Secondary | ICD-10-CM | POA: Insufficient documentation

## 2020-08-05 DIAGNOSIS — R4182 Altered mental status, unspecified: Secondary | ICD-10-CM

## 2020-08-05 DIAGNOSIS — X58XXXA Exposure to other specified factors, initial encounter: Secondary | ICD-10-CM | POA: Diagnosis not present

## 2020-08-05 DIAGNOSIS — U071 COVID-19: Secondary | ICD-10-CM | POA: Diagnosis not present

## 2020-08-05 DIAGNOSIS — B9689 Other specified bacterial agents as the cause of diseases classified elsewhere: Secondary | ICD-10-CM | POA: Diagnosis not present

## 2020-08-05 DIAGNOSIS — R609 Edema, unspecified: Secondary | ICD-10-CM

## 2020-08-05 DIAGNOSIS — I10 Essential (primary) hypertension: Secondary | ICD-10-CM | POA: Diagnosis not present

## 2020-08-05 DIAGNOSIS — E119 Type 2 diabetes mellitus without complications: Secondary | ICD-10-CM | POA: Diagnosis not present

## 2020-08-05 DIAGNOSIS — S6292XD Unspecified fracture of left wrist and hand, subsequent encounter for fracture with routine healing: Secondary | ICD-10-CM

## 2020-08-05 DIAGNOSIS — G9341 Metabolic encephalopathy: Secondary | ICD-10-CM | POA: Diagnosis not present

## 2020-08-05 DIAGNOSIS — M7989 Other specified soft tissue disorders: Secondary | ICD-10-CM | POA: Diagnosis present

## 2020-08-05 DIAGNOSIS — N10 Acute tubulo-interstitial nephritis: Secondary | ICD-10-CM

## 2020-08-05 DIAGNOSIS — S6292XA Unspecified fracture of left wrist and hand, initial encounter for closed fracture: Secondary | ICD-10-CM | POA: Insufficient documentation

## 2020-08-05 DIAGNOSIS — N12 Tubulo-interstitial nephritis, not specified as acute or chronic: Secondary | ICD-10-CM | POA: Insufficient documentation

## 2020-08-05 DIAGNOSIS — S62102S Fracture of unspecified carpal bone, left wrist, sequela: Secondary | ICD-10-CM

## 2020-08-05 DIAGNOSIS — R7401 Elevation of levels of liver transaminase levels: Secondary | ICD-10-CM

## 2020-08-05 DIAGNOSIS — Z853 Personal history of malignant neoplasm of breast: Secondary | ICD-10-CM

## 2020-08-05 DIAGNOSIS — Z23 Encounter for immunization: Secondary | ICD-10-CM | POA: Insufficient documentation

## 2020-08-05 DIAGNOSIS — N390 Urinary tract infection, site not specified: Secondary | ICD-10-CM | POA: Insufficient documentation

## 2020-08-05 DIAGNOSIS — R319 Hematuria, unspecified: Secondary | ICD-10-CM

## 2020-08-05 HISTORY — DX: Type 2 diabetes mellitus without complications: E11.9

## 2020-08-05 HISTORY — DX: Essential (primary) hypertension: I10

## 2020-08-05 LAB — RESP PANEL BY RT-PCR (FLU A&B, COVID) ARPGX2
Influenza A by PCR: NEGATIVE
Influenza B by PCR: NEGATIVE
SARS Coronavirus 2 by RT PCR: POSITIVE — AB

## 2020-08-05 LAB — CBC WITH DIFFERENTIAL/PLATELET
Abs Immature Granulocytes: 0.03 10*3/uL (ref 0.00–0.07)
Basophils Absolute: 0 10*3/uL (ref 0.0–0.1)
Basophils Relative: 0 %
Eosinophils Absolute: 0 10*3/uL (ref 0.0–0.5)
Eosinophils Relative: 0 %
HCT: 40.3 % (ref 36.0–46.0)
Hemoglobin: 13.4 g/dL (ref 12.0–15.0)
Immature Granulocytes: 1 %
Lymphocytes Relative: 32 %
Lymphs Abs: 1.4 10*3/uL (ref 0.7–4.0)
MCH: 28.9 pg (ref 26.0–34.0)
MCHC: 33.3 g/dL (ref 30.0–36.0)
MCV: 87 fL (ref 80.0–100.0)
Monocytes Absolute: 0.5 10*3/uL (ref 0.1–1.0)
Monocytes Relative: 13 %
Neutro Abs: 2.4 10*3/uL (ref 1.7–7.7)
Neutrophils Relative %: 54 %
Platelets: 242 10*3/uL (ref 150–400)
RBC: 4.63 MIL/uL (ref 3.87–5.11)
RDW: 14.5 % (ref 11.5–15.5)
WBC: 4.3 10*3/uL (ref 4.0–10.5)
nRBC: 0 % (ref 0.0–0.2)

## 2020-08-05 LAB — COMPREHENSIVE METABOLIC PANEL
ALT: 16 U/L (ref 0–44)
AST: 43 U/L — ABNORMAL HIGH (ref 15–41)
Albumin: 3.2 g/dL — ABNORMAL LOW (ref 3.5–5.0)
Alkaline Phosphatase: 43 U/L (ref 38–126)
Anion gap: 11 (ref 5–15)
BUN: 21 mg/dL (ref 8–23)
CO2: 25 mmol/L (ref 22–32)
Calcium: 8.6 mg/dL — ABNORMAL LOW (ref 8.9–10.3)
Chloride: 102 mmol/L (ref 98–111)
Creatinine, Ser: 1 mg/dL (ref 0.44–1.00)
GFR, Estimated: 53 mL/min — ABNORMAL LOW (ref >60)
Glucose, Bld: 227 mg/dL — ABNORMAL HIGH (ref 70–99)
Potassium: 4.1 mmol/L (ref 3.5–5.1)
Sodium: 138 mmol/L (ref 135–145)
Total Bilirubin: 0.9 mg/dL (ref 0.3–1.2)
Total Protein: 6.6 g/dL (ref 6.5–8.1)

## 2020-08-05 LAB — URINALYSIS, COMPLETE (UACMP) WITH MICROSCOPIC
Bilirubin Urine: NEGATIVE
Glucose, UA: >=500 mg/dL — AB
Ketones, ur: 20 mg/dL — AB
Nitrite: POSITIVE — AB
Protein, ur: >=300 mg/dL — AB
Specific Gravity, Urine: 1.025 (ref 1.005–1.030)
pH: 5 (ref 5.0–8.0)

## 2020-08-05 LAB — TSH: TSH: 4.348 u[IU]/mL (ref 0.350–4.500)

## 2020-08-05 LAB — LACTIC ACID, PLASMA: Lactic Acid, Venous: 1.9 mmol/L (ref 0.5–1.9)

## 2020-08-05 LAB — BRAIN NATRIURETIC PEPTIDE: B Natriuretic Peptide: 87.7 pg/mL (ref 0.0–100.0)

## 2020-08-05 LAB — CBG MONITORING, ED: Glucose-Capillary: 163 mg/dL — ABNORMAL HIGH (ref 70–99)

## 2020-08-05 LAB — TROPONIN I (HIGH SENSITIVITY)
Troponin I (High Sensitivity): 37 ng/L — ABNORMAL HIGH (ref <18)
Troponin I (High Sensitivity): 39 ng/L — ABNORMAL HIGH (ref <18)

## 2020-08-05 MED ORDER — SODIUM CHLORIDE 0.9 % IV SOLN
2.0000 g | INTRAVENOUS | Status: DC
  Filled 2020-08-05: qty 20

## 2020-08-05 MED ORDER — METOPROLOL TARTRATE 5 MG/5ML IV SOLN: 5.0000 mg | Freq: Four times a day (QID) | INTRAVENOUS | Status: DC | PRN

## 2020-08-05 MED ORDER — ENOXAPARIN SODIUM 40 MG/0.4ML ~~LOC~~ SOLN
0.5000 mg/kg | SUBCUTANEOUS | Status: DC
  Administered 2020-08-06 (×2): 40 mg via SUBCUTANEOUS
  Filled 2020-08-05 (×2): qty 0.4

## 2020-08-05 MED ORDER — ACETAMINOPHEN 650 MG RE SUPP: 650.0000 mg | Freq: Four times a day (QID) | RECTAL | Status: DC | PRN

## 2020-08-05 MED ORDER — CEFTRIAXONE SODIUM 1 G IJ SOLR
1.0000 g | Freq: Once | INTRAMUSCULAR | Status: AC
  Administered 2020-08-05: 1 g via INTRAVENOUS
  Filled 2020-08-05: qty 10

## 2020-08-05 MED ORDER — INSULIN ASPART 100 UNIT/ML ~~LOC~~ SOLN: 0.0000 [IU] | Freq: Every day | SUBCUTANEOUS | Status: DC

## 2020-08-05 MED ORDER — ONDANSETRON HCL 4 MG/2ML IJ SOLN: 4.0000 mg | Freq: Four times a day (QID) | INTRAMUSCULAR | Status: DC | PRN

## 2020-08-05 MED ORDER — THIAMINE HCL 100 MG/ML IJ SOLN
Freq: Once | INTRAVENOUS | Status: AC
  Filled 2020-08-05: qty 1000

## 2020-08-05 MED ORDER — FUROSEMIDE 10 MG/ML IJ SOLN
40.0000 mg | Freq: Two times a day (BID) | INTRAMUSCULAR | Status: DC
  Administered 2020-08-06: 09:00:00 40 mg via INTRAVENOUS
  Filled 2020-08-05: qty 4

## 2020-08-05 MED ORDER — ACETAMINOPHEN 325 MG PO TABS
650.0000 mg | ORAL_TABLET | Freq: Four times a day (QID) | ORAL | Status: DC | PRN
  Administered 2020-08-06: 12:00:00 650 mg via ORAL
  Filled 2020-08-05 (×2): qty 2

## 2020-08-05 MED ORDER — ONDANSETRON HCL 4 MG PO TABS: 4.0000 mg | ORAL_TABLET | Freq: Four times a day (QID) | ORAL | Status: DC | PRN

## 2020-08-05 MED ORDER — POLYETHYLENE GLYCOL 3350 17 G PO PACK: 17.0000 g | PACK | Freq: Every day | ORAL | Status: DC | PRN

## 2020-08-05 MED ORDER — INSULIN ASPART 100 UNIT/ML ~~LOC~~ SOLN
0.0000 [IU] | Freq: Three times a day (TID) | SUBCUTANEOUS | Status: DC
  Administered 2020-08-06: 09:00:00 1 [IU] via SUBCUTANEOUS
  Administered 2020-08-06: 13:00:00 3 [IU] via SUBCUTANEOUS
  Filled 2020-08-05 (×2): qty 1

## 2020-08-05 NOTE — H&P (Addendum)
History and Physical    Debbie Singleton OIB:[id number] DOB: 18-Feb-1925 DOA: 08/05/2020  PCP: Patient, No Pcp Per  Patient coming from: Home  I have personally briefly reviewed patient's old medical records in [place]  Chief Complaint: Altered mental status  HPI: Debbie Singleton is a 86 y.o. female with medical history significant of type 2 diabetes, hypertension, remote breast cancer history and recent wrist fracture who was found by her daughter today at home not acting like herself.  The patient lives independently.  The daughter saw her on Monday and noted she was having some difficulty walking with increasing swelling of her lower extremities which has been ongoing for the past 2 months.  The patient has also not seen her physician in the last 9 months.  The daughter reports mom's physician was at [facility] clinic within the physician left and she has not had access to any physician care since then and has not had access to any medications. ED Course: In the ED patient was noted to have a very dirty urine, low-grade temp to 99, and marked lower extremity edema.  Additionally she had a negative chest x-ray, normal BNP, negative troponins.  Other lab work was normal except for an elevated AST.  Review of Systems: True HPI cannot be elucidated as the patient was not really responsive.  Past Medical History: No date: Diabetes mellitus without complication ([place]) No date: Hypertension   Past Surgical History:  Procedure Laterality Date  . MASTECTOMY       reports that she has never smoked. She has never used smokeless tobacco. She reports that she does not drink alcohol and does not use drugs.  No Known Allergies  History reviewed. No pertinent family history. Mom with HTN  Prior to Admission medications   Not on File    Physical Exam: Vitals:   08/05/20 1640 08/05/20 1811 08/05/20 1930 08/05/20 2000  BP:  136/87 (!) 147/54 (!) 143/68  Pulse:  78 70 73  Resp:  20 (!)  21 20  Temp:      TempSrc:      SpO2:  98% 96% 97%  Weight: 81.6 kg     Height: 5' (1.524 m)       Constitutional: Thin, elderly appearing female.  She awakens minimally to stimuli Eyes: PERRL, lids and conjunctivae normal ENMT: Mucous membranes are moist.  Neck: normal, supple, no masses, no thyromegaly Respiratory: clear to auscultation bilaterally, no wheezing, no crackles. Normal respiratory effort.  Cardiovascular: Regular rate and rhythm, no murmurs / rubs / gallops.  3-4+ extremity edema. Abdomen: no tenderness, no masses palpated. No hepatosplenomegaly. Bowel sounds positive.  Musculoskeletal: no clubbing / cyanosis.  Swelling and erythema of the left wrist.. Normal muscle tone.  Skin: no rashes, lesions, ulcers. No induration Neurologic: Somnolent, nonfocal Psychiatric: Normal judgment and insight. Alert and oriented x 3. Normal mood.   Labs on Admission: I have personally reviewed following labs and imaging studies  CBC: Recent Labs  Lab 08/05/20 1635  WBC 4.3  NEUTROABS 2.4  HGB 13.4  HCT 40.3  MCV 87.0  PLT 945   Basic Metabolic Panel: Recent Labs  Lab 08/05/20 1635  NA 138  K 4.1  CL 102  CO2 25  GLUCOSE 227*  BUN 21  CREATININE 1.00  CALCIUM 8.6*   GFR: Estimated Creatinine Clearance: 33.2 mL/min (by C-G formula based on SCr of 1 mg/dL). Liver Function Tests: Recent Labs  Lab 08/05/20 1635  AST 43*  ALT 16  ALKPHOS 43  BILITOT 0.9  PROT 6.6  ALBUMIN 3.2*   Urine analysis:    Component Value Date/Time   COLORURINE YELLOW (A) 08/05/2020 1636   APPEARANCEUR HAZY (A) 08/05/2020 1636   APPEARANCEUR Clear 10/10/2013 0353   LABSPEC 1.025 08/05/2020 1636   LABSPEC 1.011 10/10/2013 0353   PHURINE 5.0 08/05/2020 1636   GLUCOSEU >=500 (A) 08/05/2020 1636   GLUCOSEU >=500 10/10/2013 0353   HGBUR MODERATE (A) 08/05/2020 1636   BILIRUBINUR NEGATIVE 08/05/2020 1636   BILIRUBINUR Negative 10/10/2013 0353   KETONESUR 20 (A) 08/05/2020 1636    PROTEINUR >=300 (A) 08/05/2020 1636   UROBILINOGEN 0.2 09/04/2007 1339   NITRITE POSITIVE (A) 08/05/2020 1636   LEUKOCYTESUR TRACE (A) 08/05/2020 1636   LEUKOCYTESUR Negative 10/10/2013 0353    Radiological Exams on Admission: DG Wrist Complete Left  Result Date: 08/05/2020 CLINICAL DATA:  "Patient with swelling in the area. Reportedly she fractured it several weeks ago but will not wear the brace want to make sure there is nothing bad going on there and leg badly misaligned fracture" EXAM: LEFT WRIST - COMPLETE 3+ VIEW COMPARISON:  No prior exams available. FINDINGS: Subacute impacted distal radius fracture primarily involving the metaphysis. There is some peripheral callus formation. Margins are sclerotic. Probable healing ulna styloid fracture. No evidence of acute fracture. There is slight offset of the distal radioulnar joint. Generalized soft tissue edema about the wrist. IMPRESSION: 1. Subacute impacted distal radius fracture. Margins are sclerotic with some peripheral incomplete callus formation. 2. Probable healing ulna styloid fracture. 3. Slight offset of the distal radioulnar joint, of unknown acuity. Electronically Signed   By: Keith Rake M.D.   On: 08/05/2020 17:18   DG Chest Portable 1 View  Result Date: 08/05/2020 CLINICAL DATA:  86 year old female with bilateral lower extremity edema. Concern for CHF. EXAM: PORTABLE CHEST 1 VIEW COMPARISON:  Chest radiograph dated 09/04/2007. FINDINGS: There is no focal consolidation, pleural effusion, or pneumothorax. Top-normal cardiac size. Atherosclerotic calcification of the aorta. Osteopenia with degenerative changes of the spine and shoulders. No acute osseous pathology. IMPRESSION: No active disease. Electronically Signed   By: Anner Crete M.D.   On: 08/05/2020 17:17    EKG: Independently reviewed.  Sinus rhythm, T wave abnormalities that are nonspecific.  Assessment/Plan Principal Problem:   Acute pyelonephritis Active  Problems:   Bilateral edema of lower extremity   Poorly-controlled hypertension   Type 2 diabetes mellitus not at goal [facility])   Wrist fracture, closed, left, sequela   Elevated AST (SGOT)   COVID-19 virus infection  Early pyelonephritis We will treat as such despite no elevated white count or true fever.  She does have altered mental status and a very dirty urine. Rocephin 2 g IV daily  Covid positive No respiratory symptoms Negative chest x-ray Incidentally found She is not vaccinated Consider monoclonal antibodies  Bilateral lower extremity edema No evidence of heart failure as she has a normal BNP Lasix 40 mg IV daily  Hypertension Blood pressures are generally in the 130-140 range, will trend  Type 2 diabetes On no medications at present. Check A1c Sliding scale insulin as needed  Left wrist fracture a few weeks ago Supposed to be in a wrist brace although she left that at home.  Elevated AST Trend   DVT prophylaxis: Lovenox SQ Code Status: DNR reviewed with patient's daughter, she is in agreement with this. Family Communication: Daughter at bedside Disposition Plan: Home versus other, pending PT evaluation Consults called:  None Admission status: Observation  Hopefully she will turn around quickly, however she does still live alone at 89.  We will have PT eval her for discussion around care going forward.  Additionally would consider palliative care consult just to really outline goals of care.   Donnamae Jude MD [facility]  If 7PM-7AM, please contact night-coverage 08/05/2020, 9:56 PM

## 2020-08-05 NOTE — ED Triage Notes (Signed)
Patient arrived from home where she lives alone. Son checks on her everyday. Daughter at bedside reports AMS different then baseline and weakness. Normally able to walk on her own. Since Monday, weaker and only able to take a few steps. Patient legally blind and hard of hearing. Pt A&O x4 during triage

## 2020-08-05 NOTE — TOC Initial Note (Signed)
Transition of Care [facility]) - Initial/Assessment Note    Patient Details  Name: Debbie Singleton MRN: [id number] Date of Birth: Dec 07, 1924  Transition of Care [facility]) CM/SW Contact:    Iona Beard, [place] Phone Number: 08/05/2020, 9:04 PM  Clinical Narrative:                 Pt admitted due to acute pyelonephritis. TOC received consult for HH/DME needs and PCP needs. CSW spoke with pts daughter Pricilla Handler [phone] to complete assessment. Pt lives alone and is typically able to complete ADLs independently. Pt does not drive but is provided transportation by her two sons when needed. Pt has not has [place] services and does not use any DME. Ms. Bobbye Charleston states that if recommended family will be agreeable to [facility] services. Ms. Bobbye Charleston states that pt may benefit from a bedside commode. Pt also is not currently active with a PCP since hers left from the [facility]. TOC to follow.   Expected Discharge Plan: Home/Self Care Barriers to Discharge: Continued Medical Work up   Patient Goals and CMS Choice Patient states their goals for this hospitalization and ongoing recovery are:: Return home   Choice offered to / list presented to : NA  Expected Discharge Plan and Services Expected Discharge Plan: Home/Self Care In-house Referral: Clinical Social Work Discharge Planning Services: CM Consult Post Acute Care Choice: Durable Medical Equipment Living arrangements for the past 2 months: Single Family Home                                      Prior Living Arrangements/Services Living arrangements for the past 2 months: Single Family Home Lives with:: Self   Do you feel safe going back to the place where you live?: Yes        Care giver support system in place?: Yes (comment) (two sons and daughter)   Criminal Activity/Legal Involvement Pertinent to Current Situation/Hospitalization: No - Comment as needed  Activities of Daily Living      Permission Sought/Granted                   Emotional Assessment         Alcohol / Substance Use: Not Applicable Psych Involvement: No (comment)  Admission diagnosis:  Pyelonephritis [N12] Patient Active Problem List   Diagnosis Date Noted  . Acute pyelonephritis 08/05/2020  . Wrist fracture, closed, left, sequela 08/05/2020  . Elevated AST (SGOT) 08/05/2020  . Poorly-controlled hypertension 10/16/2018  . Type 2 diabetes mellitus not at goal [facility]) 10/16/2018  . Bilateral edema of lower extremity 10/03/2018   PCP:  Patient, No Pcp Per Pharmacy:  No Pharmacies Listed    Social Determinants of Health (SDOH) Interventions    Readmission Risk Interventions No flowsheet data found.

## 2020-08-05 NOTE — ED Provider Notes (Signed)
[facility] Emergency Department Provider Note   ____________________________________________   Event Date/Time   First MD Initiated Contact with Patient 08/05/20 1624     (approximate)  I have reviewed the triage vital signs and the nursing notes.   HISTORY  Chief Complaint Altered Mental Status and Weakness  Chief complaint is bilateral leg swelling  HPI Debbie Singleton is a 86 y.o. female who lives at home by herself.  She is blind.  Her doctor retired and moved out of town about a year ago and she has not seen anyone for approximately a year.  Her daughter noticed that her legs were swelling on Monday when she visited and now they are bigger.  Additionally patient fell and apparently broke her left wrist but will not wear the wrist brace.  Patient herself says she feels well and nothing is bothering her.         Past history copied from chart from last year's office visit  Diagnosis Date  . Blind  . Hypertension   additionally poorly controlled diabetes past Surgical History:  Procedure Laterality Date  . CHOLECYSTECTOMY  . MASTECTOMY Left  . TUBAL LIGATION      Prior to Admission medications   Not on File    Allergies Patient has no known allergies. No known allergies as of 10/16/2018 office visit History reviewed. No pertinent family history.  Social History Social History   Tobacco Use  . Smoking status: Never Smoker  . Smokeless tobacco: Never Used  Substance Use Topics  . Alcohol use: Never  . Drug use: Never    Review of Systems  Constitutional: Low-grade fever of 99 per EMS Eyes: No visual changes.  Patient is blind ENT: No sore throat. Cardiovascular: Denies chest pain. Respiratory: Denies shortness of breath. Gastrointestinal: No abdominal pain.  No nausea, no vomiting.  No diarrhea.  No constipation. Genitourinary: Negative for dysuria. Musculoskeletal: Negative for back pain. Skin: Negative for  rash. Neurological: Negative for headaches, focal weakness   ____________________________________________   PHYSICAL EXAM:  VITAL SIGNS: ED Triage Vitals [08/05/20 1613]  Enc Vitals Group     BP (!) 141/76     Pulse Rate 84     Resp 18     Temp 99.3 F (37.4 C)     Temp Source Oral     SpO2 99 %     Weight      Height      Head Circumference      Peak Flow      Pain Score      Pain Loc      Pain Edu?      Excl. in [place]?    Constitutional: Alert and oriented. Well appearing and in no acute distress. Eyes: Conjunctivae are normal.  Head: Atraumatic. Nose: No congestion/rhinnorhea. Mouth/Throat: Mucous membranes are moist.  Oropharynx non-erythematous. Neck: No stridor.   Cardiovascular: Normal rate, regular rhythm. Grossly normal heart sounds.  Good peripheral circulation. Respiratory: Normal respiratory effort.  No retractions. Lungs CTAB. Gastrointestinal: Soft and nontender. No distention. No abdominal bruits.  Musculoskeletal: No lower extremity tenderness 2+ bilateral edema.  Additionally left wrist is somewhat swollen Neurologic:  Normal speech and language. No new gross focal neurologic deficits are appreciated.  Skin:  Skin is warm, dry and intact. No rash noted.   ____________________________________________   LABS (all labs ordered are listed, but only abnormal results are displayed)  Labs Reviewed  COMPREHENSIVE METABOLIC PANEL - Abnormal; Notable for the  following components:      Result Value   Glucose, Bld 227 (*)    Calcium 8.6 (*)    Albumin 3.2 (*)    AST 43 (*)    GFR, Estimated 53 (*)    All other components within normal limits  URINALYSIS, COMPLETE (UACMP) WITH MICROSCOPIC - Abnormal; Notable for the following components:   Color, Urine YELLOW (*)    APPearance HAZY (*)    Glucose, UA >=500 (*)    Hgb urine dipstick MODERATE (*)    Ketones, ur 20 (*)    Protein, ur >=300 (*)    Nitrite POSITIVE (*)    Leukocytes,Ua TRACE (*)     Bacteria, UA MANY (*)    All other components within normal limits  TROPONIN I (HIGH SENSITIVITY) - Abnormal; Notable for the following components:   Troponin I (High Sensitivity) 37 (*)    All other components within normal limits  TROPONIN I (HIGH SENSITIVITY) - Abnormal; Notable for the following components:   Troponin I (High Sensitivity) 39 (*)    All other components within normal limits  BRAIN NATRIURETIC PEPTIDE  LACTIC ACID, PLASMA  CBC WITH DIFFERENTIAL/PLATELET   ____________________________________________  EKG   ____________________________________________  RADIOLOGY Gertha Calkin, personally viewed and evaluated these images (plain radiographs) as part of my medical decision making, as well as reviewing the written report by the radiologist.  ED MD interpretation:   Official radiology report(s): DG Wrist Complete Left  Result Date: 08/05/2020 CLINICAL DATA:  "Patient with swelling in the area. Reportedly she fractured it several weeks ago but will not wear the brace want to make sure there is nothing bad going on there and leg badly misaligned fracture" EXAM: LEFT WRIST - COMPLETE 3+ VIEW COMPARISON:  No prior exams available. FINDINGS: Subacute impacted distal radius fracture primarily involving the metaphysis. There is some peripheral callus formation. Margins are sclerotic. Probable healing ulna styloid fracture. No evidence of acute fracture. There is slight offset of the distal radioulnar joint. Generalized soft tissue edema about the wrist. IMPRESSION: 1. Subacute impacted distal radius fracture. Margins are sclerotic with some peripheral incomplete callus formation. 2. Probable healing ulna styloid fracture. 3. Slight offset of the distal radioulnar joint, of unknown acuity. Electronically Signed   By: Keith Rake M.D.   On: 08/05/2020 17:18   DG Chest Portable 1 View  Result Date: 08/05/2020 CLINICAL DATA:  86 year old female with bilateral lower  extremity edema. Concern for CHF. EXAM: PORTABLE CHEST 1 VIEW COMPARISON:  Chest radiograph dated 09/04/2007. FINDINGS: There is no focal consolidation, pleural effusion, or pneumothorax. Top-normal cardiac size. Atherosclerotic calcification of the aorta. Osteopenia with degenerative changes of the spine and shoulders. No acute osseous pathology. IMPRESSION: No active disease. Electronically Signed   By: Anner Crete M.D.   On: 08/05/2020 17:17    ____________________________________________   PROCEDURES  Procedure(s) performed (including Critical Care):  Procedures   ____________________________________________   INITIAL IMPRESSION / ASSESSMENT AND PLAN / ED COURSE    Patient with altered mental status per daughter.  She has a recent fracture of her wrist which is beginning to heal but which she will will not wear the wrist splint for.  She has peripheral edema but her lungs are clear and she is not hypoxic.  She does have a low-grade fever and a UTI.  I will get her some antibiotics at think we can watch her overnight make sure she is stable begin some diuresis hopefully discharge  her quickly.          ____________________________________________   FINAL CLINICAL IMPRESSION(S) / ED DIAGNOSES  Final diagnoses:  Urinary tract infection with hematuria, site unspecified  Altered mental status, unspecified altered mental status type  Peripheral edema     ED Discharge Orders    None      *Please note:  Debbie Singleton was evaluated in Emergency Department on 08/05/2020 for the symptoms described in the history of present illness. She was evaluated in the context of the global COVID-19 pandemic, which necessitated consideration that the patient might be at risk for infection with the SARS-CoV-2 virus that causes COVID-19. Institutional protocols and algorithms that pertain to the evaluation of patients at risk for COVID-19 are in a state of rapid change based on information  released by regulatory bodies including the CDC and federal and state organizations. These policies and algorithms were followed during the patient's care in the ED.  Some ED evaluations and interventions may be delayed as a result of limited staffing during and the pandemic.*   Note:  This document was prepared using Dragon voice recognition software and may include unintentional dictation errors.    Nena Polio, MD 08/05/20 [phone]

## 2020-08-05 NOTE — ED Notes (Signed)
Report given to Chrissie RN

## 2020-08-05 NOTE — ED Notes (Signed)
Belongings: Theatre manager of cash that belonged to patient given to daughter upon arrival/helping patient to bathroom.

## 2020-08-06 DIAGNOSIS — R4182 Altered mental status, unspecified: Secondary | ICD-10-CM | POA: Diagnosis not present

## 2020-08-06 DIAGNOSIS — M7989 Other specified soft tissue disorders: Secondary | ICD-10-CM | POA: Diagnosis present

## 2020-08-06 DIAGNOSIS — N390 Urinary tract infection, site not specified: Secondary | ICD-10-CM

## 2020-08-06 DIAGNOSIS — U071 COVID-19: Secondary | ICD-10-CM

## 2020-08-06 DIAGNOSIS — Z23 Encounter for immunization: Secondary | ICD-10-CM | POA: Diagnosis not present

## 2020-08-06 DIAGNOSIS — X58XXXA Exposure to other specified factors, initial encounter: Secondary | ICD-10-CM | POA: Diagnosis not present

## 2020-08-06 DIAGNOSIS — R319 Hematuria, unspecified: Secondary | ICD-10-CM | POA: Diagnosis not present

## 2020-08-06 LAB — CBC
HCT: 35.3 % — ABNORMAL LOW (ref 36.0–46.0)
Hemoglobin: 11.7 g/dL — ABNORMAL LOW (ref 12.0–15.0)
MCH: 28.8 pg (ref 26.0–34.0)
MCHC: 33.1 g/dL (ref 30.0–36.0)
MCV: 86.9 fL (ref 80.0–100.0)
Platelets: 219 10*3/uL (ref 150–400)
RBC: 4.06 MIL/uL (ref 3.87–5.11)
RDW: 14.3 % (ref 11.5–15.5)
WBC: 4.8 10*3/uL (ref 4.0–10.5)
nRBC: 0 % (ref 0.0–0.2)

## 2020-08-06 LAB — BASIC METABOLIC PANEL
Anion gap: 9 (ref 5–15)
BUN: 19 mg/dL (ref 8–23)
CO2: 24 mmol/L (ref 22–32)
Calcium: 8.1 mg/dL — ABNORMAL LOW (ref 8.9–10.3)
Chloride: 104 mmol/L (ref 98–111)
Creatinine, Ser: 0.89 mg/dL (ref 0.44–1.00)
GFR, Estimated: >60 mL/min (ref >60)
Glucose, Bld: 156 mg/dL — ABNORMAL HIGH (ref 70–99)
Potassium: 3.9 mmol/L (ref 3.5–5.1)
Sodium: 137 mmol/L (ref 135–145)

## 2020-08-06 LAB — GLUCOSE, CAPILLARY
Glucose-Capillary: 129 mg/dL — ABNORMAL HIGH (ref 70–99)
Glucose-Capillary: 207 mg/dL — ABNORMAL HIGH (ref 70–99)
Glucose-Capillary: 108 mg/dL — ABNORMAL HIGH (ref 70–99)
Glucose-Capillary: 152 mg/dL — ABNORMAL HIGH (ref 70–99)

## 2020-08-06 LAB — HEMOGLOBIN A1C
Hgb A1c MFr Bld: 9.1 % — ABNORMAL HIGH (ref 4.8–5.6)
Mean Plasma Glucose: 214.47 mg/dL

## 2020-08-06 MED ORDER — METHYLPREDNISOLONE SODIUM SUCC 125 MG IJ SOLR: 125.0000 mg | Freq: Once | INTRAMUSCULAR | Status: DC | PRN

## 2020-08-06 MED ORDER — VITAMIN D 25 MCG (1000 UNIT) PO TABS: 1000.0000 [IU] | ORAL_TABLET | Freq: Every day | ORAL | 0 refills | Status: AC

## 2020-08-06 MED ORDER — ZINC 220 (50 ZN) MG PO CAPS: 220.0000 mg | ORAL_CAPSULE | Freq: Every day | ORAL | 0 refills | Status: AC

## 2020-08-06 MED ORDER — VITAMIN C 250 MG PO TABS: 500.0000 mg | ORAL_TABLET | Freq: Every day | ORAL | 0 refills | Status: AC

## 2020-08-06 MED ORDER — FAMOTIDINE IN NACL 20-0.9 MG/50ML-% IV SOLN
20.0000 mg | Freq: Once | INTRAVENOUS | Status: DC | PRN
  Filled 2020-08-06: qty 50

## 2020-08-06 MED ORDER — EPINEPHRINE 0.3 MG/0.3ML IJ SOAJ
0.3000 mg | Freq: Once | INTRAMUSCULAR | Status: DC | PRN
  Filled 2020-08-06: qty 0.3

## 2020-08-06 MED ORDER — SODIUM CHLORIDE 0.9 % IV SOLN
1200.0000 mg | Freq: Once | INTRAVENOUS | Status: AC
  Administered 2020-08-06: 14:00:00 1200 mg via INTRAVENOUS
  Filled 2020-08-06: qty 10

## 2020-08-06 MED ORDER — FOSFOMYCIN TROMETHAMINE 3 G PO PACK
3.0000 g | PACK | Freq: Once | ORAL | Status: AC
  Administered 2020-08-06: 11:00:00 3 g via ORAL
  Filled 2020-08-06: qty 3

## 2020-08-06 MED ORDER — SODIUM CHLORIDE 0.9 % IV SOLN: INTRAVENOUS | Status: DC | PRN

## 2020-08-06 MED ORDER — SODIUM CHLORIDE 0.9 % IV SOLN
Freq: Once | INTRAVENOUS | Status: DC
  Filled 2020-08-06: qty 20

## 2020-08-06 MED ORDER — ALBUTEROL SULFATE HFA 108 (90 BASE) MCG/ACT IN AERS
2.0000 | INHALATION_SPRAY | Freq: Once | RESPIRATORY_TRACT | Status: DC | PRN
  Filled 2020-08-06: qty 6.7

## 2020-08-06 MED ORDER — DIPHENHYDRAMINE HCL 50 MG/ML IJ SOLN: 50.0000 mg | Freq: Once | INTRAMUSCULAR | Status: DC | PRN

## 2020-08-06 NOTE — Discharge Instructions (Signed)
Self quarantine for 14 days. Follow-up with PCP in 1 week.

## 2020-08-06 NOTE — Consult Note (Signed)
Pharmacy COVID-19 Monoclonal Antibody Screening  Debbie Singleton was identified as being not hospitalized with symptoms from Covid-19 on admission but an incidental positive PCR has been documented.  The patient may qualify for the use of monoclonal antibodies (mAB) for COVID-19 viral infection to prevent worsening symptoms stemming from Covid-19 infection.  The patient was identified based on a positive COVID-19 PCR and not requiring the use of supplemental oxygen at this time.  This patient meets the FDA criteria for Emergency Use Authorization of casirivimab/imdevimab or bamlanivimab/etesevimab.  Has a (+) direct SARS-CoV-2 viral test result  Is NOT hospitalized due to COVID-19  Is within 10 days of symptom onset  Has at least one of the high risk factor(s) for progression to severe COVID-19 and/or hospitalization as defined in EUA.  Specific high risk criteria : Older age (>/= 84 yo), Diabetes and Cardiovascular disease or hypertension  Additionally: The patient has had a positive COVID-19 PCR in the last 90 days.  The patient is unvaccinated against COVID-19.  Since the patient is unvaccinated and meets high risk criteria, the patient is eligible for mAB administration.   This eligibility and indication for treatment was discussed with the patient's physician: Yes  Plan: Based on the above discussion, it was decided that the patient will receive one dose of the available COVID-19 mAB combination. Pharmacy will coordinate administration timing with patient's nurse. Recommended infusion monitoring parameters communicated to the nursing team.   Lu Duffel, PharmD, BCPS Clinical Pharmacist 08/06/2020 9:55 AM

## 2020-08-06 NOTE — Plan of Care (Signed)
  Problem: Education: Goal: Knowledge of risk factors and measures for prevention of condition will improve Outcome: Progressing   Problem: Coping: Goal: Psychosocial and spiritual needs will be supported Outcome: Progressing   Problem: Respiratory: Goal: Will maintain a patent airway Outcome: Progressing Goal: Complications related to the disease process, condition or treatment will be avoided or minimized Outcome: Progressing   

## 2020-08-06 NOTE — Care Management Obs Status (Signed)
MEDICARE OBSERVATION STATUS NOTIFICATION   Patient Details  Name: Debbie Singleton MRN: [id number] Date of Birth: 1925/07/03   Medicare Observation Status Notification Given:  Yes    Shelbie Hutching, RN 08/06/2020, 3:37 PM

## 2020-08-06 NOTE — Discharge Summary (Addendum)
Physician Discharge Summary  Patient ID: Debbie Singleton MRN: [id number] DOB/AGE: May 24, 1925 86 y.o.  Admit date: 08/05/2020 Discharge date: 08/06/2020  Admission Diagnoses:  Discharge Diagnoses:  Principal Problem:   Acute pyelonephritis Active Problems:   Bilateral edema of lower extremity   Poorly-controlled hypertension   Type 2 diabetes mellitus not at goal [facility])   Wrist fracture, closed, left, sequela   Elevated AST (SGOT)   COVID-19 virus infection   Pyelonephritis   Discharged Condition: good  Hospital Course:  Debbie Singleton is a 86 y.o. female with medical history significant of type 2 diabetes, hypertension, remote breast cancer history and recent wrist fracture who was found by her daughter today at home not acting like herself.  The patient lives independently.  The daughter saw her on Monday and noted she was having some difficulty walking with increasing swelling of her lower extremities which has been ongoing for the past 2 months.   In the emergency room, she was found to have positive Covid, chest x-ray does not have any infiltrates.  She does not have any hypoxemia or shortness of breath. She was also found to have abnormal urine with some altered mental status.  She was started on Rocephin for UTI. Patient does not have a family doctor listed in chart, consulted [place] for PCP follow-up.  #1.  Acute metabolic cephalopathy. Urinary tract infection. Pyelonephritis ruled out. Patient has abnormal urine, does not have urinary symptoms.  She has no back pain, she has no CVA tenderness.  She does not have pyelonephritis. Her mental status seem to be improved today.  Discussed with the patient family, patient can go home today to the family. She has received Rocephin in the hospital, I will give her a dose of fosfomycin as patient does not have pyelonephritis or sepsis.  #2.  Covid infection. Patient does not have any respiratory symptoms or hypoxemia.  We will give  a dose of monoclonal antibody.  Spoke with patient daughter, she is agreeable.  #3.  Bilateral lower extremity edema. Patient does not have congestive heart failure, BNP normal. Advised family to place TED hose.  #4.  Type 2 diabetes. No need for treatment due to advanced age.  #5 elevated AST. Follow-up as outpatient.  1238. Patient developed a high fever, will cancel discharge and monitor for one more day.    Consults: None  Significant Diagnostic Studies:   Treatments: Rocephin, monoclonal antibody, fosfomycin  Discharge Exam: Blood pressure (!) 152/61, pulse 74, temperature 99.7 F (37.6 C), temperature source Oral, resp. rate 18, height 5' (1.524 m), weight 81.6 kg, SpO2 97 %. General appearance: alert, cooperative and Orientated to people and place. Resp: clear to auscultation bilaterally Cardio: regular rate and rhythm, S1, S2 normal, no murmur, click, rub or gallop GI: soft, non-tender; bowel sounds normal; no masses,  no organomegaly Extremities: 1+ edema  Disposition: Discharge disposition: 01-Home or Self Care       Discharge Instructions    Diet - low sodium heart healthy   Complete by: As directed    Increase activity slowly   Complete by: As directed      Allergies as of 08/06/2020   No Known Allergies     Medication List    TAKE these medications   cholecalciferol 25 MCG (1000 UNIT) tablet Commonly known as: VITAMIN D3 Take 1 tablet (1,000 Units total) by mouth daily for 14 days.   vitamin C 250 MG tablet Commonly known as: ASCORBIC ACID Take 2 tablets (  500 mg total) by mouth daily for 14 days.   Zinc 220 (50 Zn) MG Caps Take 220 mg by mouth daily for 14 days.        Signed: Sharen Hones 08/06/2020, 10:25 AM

## 2020-08-06 NOTE — TOC Progression Note (Signed)
Transition of Care [facility]) - Progression Note    Patient Details  Name: Debbie Singleton MRN: [id number] Date of Birth: [birth year]  Transition of Care [facility]) CM/SW Contact  Shelbie Hutching, RN Phone Number: 08/06/2020, 3:38 PM  Clinical Narrative:    Referral for home health services given to [facility] with [facility] he will let TOC know if they can accept the patient.  RW and 3 in1 has been delivered to the patient's room.  Plan for discharge tomorrow.    Expected Discharge Plan: [place] Barriers to Discharge: Continued Medical Work up  Expected Discharge Plan and Services Expected Discharge Plan: [place] In-house Referral: Clinical Social Work Discharge Planning Services: CM Consult Post Acute Care Choice: [place] arrangements for the past 2 months: [place] Expected Discharge Date: 08/06/20               DME Arranged: Gilford Rile rolling,3-N-1 DME Agency: [facility] Date DME Agency Contacted: 08/06/20 Time DME Agency Contacted: 1400 Representative spoke with at [place]: [place]: [place] Work [employer] Agency: Microbiologist ([place]) Date [place]: 08/06/20 Time Portal: 1538 Representative spoke with at [place]: Person ([place]) Interventions    Readmission Risk Interventions No flowsheet data found.

## 2020-08-06 NOTE — Progress Notes (Signed)
Called daughter at home Pricilla Handler), spoke with her that MD had ordered Regen-COV for her mom to treat her Covid I went over fact sheet on med for patients and caregivers along with possible allergic reactions and side effects.  .  She said she was fine wiith [place] giving her mom the med. I was unable the reach son Makayia Duplessis on phone, but left message for him to call us back.

## 2020-08-06 NOTE — Evaluation (Signed)
Physical Therapy Evaluation Patient Details Name: Debbie Singleton MRN: [id number] DOB: 04-Oct-1924 Today's Date: 08/06/2020   History of Present Illness  Pt is a 86 y.o. female with medical history significant of type 2 diabetes, hypertension, remote breast cancer history and recent wrist fracture (left per daughter) who was found by her daughter today at home not acting like herself.  MD assessment inlcudes: Acute metabolic cephalopathy, UTI, covid infection, BLE edema, fever, and elevated AST.    Clinical Impression  Pt confused and disoriented during the session and required extensive multi-modal cuing and time in order to follow simple 1-step commands and was able to do so <50% of the time.  Overall pt appeared functionally weak when she would attempt movements and required min A with bed mobility tasks and transfers.  Once in standing pt was fairly steady and took several small steps to her recliner with light touch on the RW with her LUE with no apparent distress.  Pt typically lives alone and is Ind with amb without an AD and Ind with ADLs.  Pt would not be safe to return to her prior living situation at this time and will benefit from PT services in a SNF setting upon discharge to safely address deficits listed in patient problem list for decreased caregiver assistance and eventual return to PLOF.  It is unclear how much her limited cognition is contributing to her functional deficits and will watch for possible upgrade in recommendation going forward.        Follow Up Recommendations SNF;Supervision/Assistance - 24 hour    Equipment Recommendations  Other (comment) (TBD at a later date, pt with unkown WB status to the L wrist)    Recommendations for Other Services       Precautions / Restrictions Precautions Precautions: Fall Restrictions Other Position/Activity Restrictions: Per chart review and phone call with pt's daughter pt fell recently with L wrist injury and typically wears  a splint, MD/nsg made aware, unknown WB status     Mobility  Bed Mobility Overal bed mobility: Needs Assistance Bed Mobility: Supine to Sit     Supine to sit: Min assist     General bed mobility comments: Min A for BLE and trunk control    Transfers Overall transfer level: Needs assistance Equipment used: Rolling walker (2 wheeled) Transfers: Sit to/from Stand Sit to Stand: From elevated surface;Min assist         General transfer comment: Min A to stand from an elevated EOB  Ambulation/Gait Ambulation/Gait assistance: Min guard Gait Distance (Feet): 3 Feet Assistive device: Rolling walker (2 wheeled) Gait Pattern/deviations: Step-to pattern;Shuffle Gait velocity: decreased   General Gait Details: Pt able to take several small shuffling steps from the bed to the chair with mod to max verbal cues for sequencing; light touch on the RW with the LUE and ensured no reachback to arm rest with LUE during stand to sit  Stairs            Wheelchair Mobility    Modified Rankin (Stroke Patients Only)       Balance Overall balance assessment: Needs assistance   Sitting balance-Leahy Scale: Good     Standing balance support: Bilateral upper extremity supported;During functional activity Standing balance-Leahy Scale: Good                               Pertinent Vitals/Pain Pain Assessment: No/denies pain    Home Living Family/patient expects  to be discharged to:: Private residence Living Arrangements: Alone Available Help at Discharge: Family;Available PRN/intermittently Type of Home: Apartment Home Access: Level entry     Home Layout: One level Home Equipment: None Additional Comments: Pt's son checks on patient daily    Prior Function Level of Independence: Independent         Comments: Pt Ind with amb without an AD, Ind with ADLs, one fall in the last 6 months     Hand Dominance        Extremity/Trunk Assessment   Upper  Extremity Assessment Upper Extremity Assessment: Generalized weakness;Difficult to assess due to impaired cognition    Lower Extremity Assessment Lower Extremity Assessment: Generalized weakness;Difficult to assess due to impaired cognition       Communication   Communication: HOH  Cognition Arousal/Alertness: Awake/alert Behavior During Therapy: Agitated Overall Cognitive Status: No family/caregiver present to determine baseline cognitive functioning                                 General Comments: Pt confused and oriented to name only, difficulty following commands and provided very limited to no information when questioned      General Comments      Exercises     Assessment/Plan    PT Assessment Patient needs continued PT services  PT Problem List Decreased strength;Decreased activity tolerance;Decreased balance;Decreased mobility;Decreased cognition;Decreased knowledge of use of DME       PT Treatment Interventions DME instruction;Gait training;Functional mobility training;Therapeutic activities;Therapeutic exercise;Balance training;Patient/family education    PT Goals (Current goals can be found in the Care Plan section)  Acute Rehab PT Goals PT Goal Formulation: Patient unable to participate in goal setting Time For Goal Achievement: 08/19/20 Potential to Achieve Goals: Good    Frequency Min 2X/week   Barriers to discharge Inaccessible home environment;Decreased caregiver support      Co-evaluation               AM-PAC PT "6 Clicks" Mobility  Outcome Measure Help needed turning from your back to your side while in a flat bed without using bedrails?: A Little Help needed moving from lying on your back to sitting on the side of a flat bed without using bedrails?: A Little Help needed moving to and from a bed to a chair (including a wheelchair)?: A Little Help needed standing up from a chair using your arms (e.g., wheelchair or bedside  chair)?: A Little Help needed to walk in hospital room?: A Lot Help needed climbing 3-5 steps with a railing? : Total 6 Click Score: 15    End of Session Equipment Utilized During Treatment: Gait belt Activity Tolerance: Patient tolerated treatment well Patient left: in chair;with call bell/phone within reach;with chair alarm set Nurse Communication: Mobility status;Other (comment) (Pt with L wrist injury requiring splint per phone call with daughter, MD and nursing notified pt here without splint donned) PT Visit Diagnosis: Difficulty in walking, not elsewhere classified (R26.2);Muscle weakness (generalized) (M62.81)    Time: 1405-1501 PT Time Calculation (min) (ACUTE ONLY): 56 min   Charges:   PT Evaluation $PT Eval Moderate Complexity: 1 Mod PT Treatments $Therapeutic Activity: 8-22 mins        D. Royetta Asal PT, DPT 08/06/20, 3:41 PM

## 2020-08-07 DIAGNOSIS — R600 Localized edema: Secondary | ICD-10-CM

## 2020-08-07 DIAGNOSIS — N390 Urinary tract infection, site not specified: Secondary | ICD-10-CM | POA: Diagnosis not present

## 2020-08-07 DIAGNOSIS — U071 COVID-19: Secondary | ICD-10-CM | POA: Diagnosis not present

## 2020-08-07 LAB — GLUCOSE, CAPILLARY: Glucose-Capillary: 116 mg/dL — ABNORMAL HIGH (ref 70–99)

## 2020-08-07 NOTE — TOC Transition Note (Addendum)
Transition of Care [facility]) - CM/SW Discharge Note   Patient Details  Name: Debbie Singleton MRN: [id number] Date of Birth: 1924/09/11  Transition of Care [facility]) CM/SW Contact:  Shelbie Hutching, RN Phone Number: 08/07/2020, 11:21 AM   Clinical Narrative:    Patient is medically cleared for discharge home with home health services through [facility].  RNCM has scheduled patient at [facility] for December 28th at 1045 for a new patient appointment.  Appointment added to the after visit summary.  3 in 1 and RW delivered to the patient's room yesterday by [facility] for patient to go home with.  Patient's son will be picking her up today and daughter, Debbie Singleton, reports that the patient will have someone staying with her 24/7.    PT did recommend SNF, RNCM discussed this with patient's daughter via phone and daughter has decided that the patient should go home and not to SNF.     Final next level of care: [place] Barriers to Discharge: Barriers Resolved   Patient Goals and CMS Choice Patient states their goals for this hospitalization and ongoing recovery are:: Return home CMS Medicare.gov Compare Post Acute Care list provided to:: Patient Represenative (must comment) Choice offered to / list presented to : Adult Children  Discharge Placement                       Discharge Plan and Services In-house Referral: Clinical Social Work Discharge Planning Services: CM Consult Post Acute Care Choice: [place]          DME Arranged: Gilford Rile rolling,3-N-1 DME Agency: [facility] Date DME Agency Contacted: 08/06/20 Time DME Agency Contacted: 71 Representative spoke with at [place]: [place]: RN,Disease Management,OT,Nurse's Aide [place] Agency: [place] ([place]) Date [place]: 08/07/20 Time [place]: 1120 Representative spoke with at [place]: [place] ([place])  Interventions     Readmission Risk Interventions No flowsheet data found.

## 2020-08-07 NOTE — Discharge Summary (Signed)
Physician Discharge Summary  Patient ID: Debbie Singleton MRN: [id number] DOB/AGE: 1925/06/14 86 y.o.  Admit date: 08/05/2020 Discharge date: 08/07/2020  Admission Diagnoses:  Discharge Diagnoses:  Active Problems:   Bilateral edema of lower extremity   Poorly-controlled hypertension   Type 2 diabetes mellitus not at goal [facility])   Wrist fracture, closed, left, sequela   Elevated AST (SGOT)   COVID-19 virus infection   Acute lower UTI   Discharged Condition: fair  Hospital Course:  #1.  Acute metabolic cephalopathy. Urinary tract infection. Pyelonephritis ruled out. Patient has abnormal urine, does not have urinary symptoms.  She has no back pain, she has no CVA tenderness.  She does not have pyelonephritis. Her mental status seem to be improved today.  Discussed with the patient family, patient can go home today to the family. She has received Rocephin in the hospital, I will give her a dose of fosfomycin as patient does not have pyelonephritis or sepsis.  #2.  Covid infection. Patient does not have any respiratory symptoms or hypoxemia.  We will give a dose of monoclonal antibody.  Spoke with patient daughter, she is agreeable.  #3.  Bilateral lower extremity edema. Patient does not have congestive heart failure, BNP normal. Advised family to place TED hose.  #4.  Type 2 diabetes. No need for treatment due to advanced age.  #5 elevated AST. Follow-up as outpatient.  Patient developed high fever yesterday, not able to discharge.  Patient was kept in the hospital for another day, no longer has any fever.  He received a monoclonal antibody.. Currently refused to take any medicine or eat, discussed with patient daughter, she probably will do better at home in familiar environment.  She also evaluated by physical therapy, recommended nursing placement, but family wished to take her home instead of going to nursing home. Due to her advanced age and multiple medical problems,  her long-term prognosis is poor.  Consults: None  Significant Diagnostic Studies:  PORTABLE CHEST 1 VIEW  COMPARISON:  Chest radiograph dated 09/04/2007.  FINDINGS: There is no focal consolidation, pleural effusion, or pneumothorax. Top-normal cardiac size. Atherosclerotic calcification of the aorta. Osteopenia with degenerative changes of the spine and shoulders. No acute osseous pathology.  IMPRESSION: No active disease.   Electronically Signed   By: Anner Crete M.D.   On: 08/05/2020 17:17  Treatments:, Monoclonal antibodies, fosfomycin  Discharge Exam: Blood pressure (!) 152/68, pulse 81, temperature 98.7 F (37.1 C), temperature source Oral, resp. rate 18, height 5' (1.524 m), weight 70.8 kg, SpO2 96 %. General appearance: alert, cooperative and Some confusion, does not like to talk Resp: clear to auscultation bilaterally Cardio: regular rate and rhythm, S1, S2 normal, no murmur, click, rub or gallop GI: soft, non-tender; bowel sounds normal; no masses,  no organomegaly Extremities: 1+ edema  Disposition: Discharge disposition: 01-Home or Self Care       Discharge Instructions    Diet - low sodium heart healthy   Complete by: As directed    Diet - low sodium heart healthy   Complete by: As directed    Increase activity slowly   Complete by: As directed    Increase activity slowly   Complete by: As directed      Allergies as of 08/07/2020   No Known Allergies     Medication List    TAKE these medications   cholecalciferol 25 MCG (1000 UNIT) tablet Commonly known as: VITAMIN D3 Take 1 tablet (1,000 Units total) by mouth daily  for 14 days.   vitamin C 250 MG tablet Commonly known as: ASCORBIC ACID Take 2 tablets (500 mg total) by mouth daily for 14 days.   Zinc 220 (50 Zn) MG Caps Take 220 mg by mouth daily for 14 days.            Durable Medical Equipment  (From admission, onward)         Start     Ordered   08/06/20 1313   For home use only DME 3 n 1  Once        08/06/20 1312   08/06/20 1313  For home use only DME Walker rolling  Once       Question Answer Comment  Walker: With 5 Inch Wheels   Patient needs a walker to treat with the following condition COVID-19   Patient needs a walker to treat with the following condition Weakness generalized      08/06/20 1312           Signed: Sharen Hones 08/07/2020, 10:37 AM

## 2020-08-07 NOTE — Progress Notes (Signed)
Received Md order to discharge patient to home, reviewed home meds, discharged instructions, prescriptions and follow up appointmentas with son Shanecia Hoganson and he verbalized understanding.

## 2020-08-28 ENCOUNTER — Emergency Department: Payer: Medicare Other

## 2020-08-28 ENCOUNTER — Inpatient Hospital Stay
Admission: EM | Admit: 2020-08-28 | Discharge: 2020-09-22 | DRG: 208 | Disposition: E | Payer: Medicare Other | Attending: Internal Medicine | Admitting: Internal Medicine

## 2020-08-28 ENCOUNTER — Other Ambulatory Visit: Payer: Self-pay

## 2020-08-28 DIAGNOSIS — R4182 Altered mental status, unspecified: Secondary | ICD-10-CM | POA: Diagnosis present

## 2020-08-28 DIAGNOSIS — L89152 Pressure ulcer of sacral region, stage 2: Secondary | ICD-10-CM | POA: Diagnosis present

## 2020-08-28 DIAGNOSIS — J159 Unspecified bacterial pneumonia: Secondary | ICD-10-CM | POA: Diagnosis present

## 2020-08-28 DIAGNOSIS — Z66 Do not resuscitate: Secondary | ICD-10-CM | POA: Diagnosis present

## 2020-08-28 DIAGNOSIS — J9600 Acute respiratory failure, unspecified whether with hypoxia or hypercapnia: Secondary | ICD-10-CM

## 2020-08-28 DIAGNOSIS — I959 Hypotension, unspecified: Secondary | ICD-10-CM | POA: Diagnosis present

## 2020-08-28 DIAGNOSIS — Z515 Encounter for palliative care: Secondary | ICD-10-CM | POA: Diagnosis not present

## 2020-08-28 DIAGNOSIS — E870 Hyperosmolality and hypernatremia: Secondary | ICD-10-CM | POA: Diagnosis present

## 2020-08-28 DIAGNOSIS — G928 Other toxic encephalopathy: Secondary | ICD-10-CM | POA: Diagnosis present

## 2020-08-28 DIAGNOSIS — Z9012 Acquired absence of left breast and nipple: Secondary | ICD-10-CM

## 2020-08-28 DIAGNOSIS — J9602 Acute respiratory failure with hypercapnia: Secondary | ICD-10-CM | POA: Diagnosis present

## 2020-08-28 DIAGNOSIS — J9601 Acute respiratory failure with hypoxia: Secondary | ICD-10-CM | POA: Diagnosis present

## 2020-08-28 DIAGNOSIS — U071 COVID-19: Secondary | ICD-10-CM | POA: Diagnosis present

## 2020-08-28 DIAGNOSIS — E0811 Diabetes mellitus due to underlying condition with ketoacidosis with coma: Secondary | ICD-10-CM | POA: Diagnosis not present

## 2020-08-28 DIAGNOSIS — I10 Essential (primary) hypertension: Secondary | ICD-10-CM | POA: Diagnosis present

## 2020-08-28 DIAGNOSIS — R579 Shock, unspecified: Secondary | ICD-10-CM | POA: Diagnosis present

## 2020-08-28 DIAGNOSIS — H548 Legal blindness, as defined in USA: Secondary | ICD-10-CM | POA: Diagnosis present

## 2020-08-28 DIAGNOSIS — E1111 Type 2 diabetes mellitus with ketoacidosis with coma: Secondary | ICD-10-CM | POA: Diagnosis present

## 2020-08-28 DIAGNOSIS — J1282 Pneumonia due to coronavirus disease 2019: Secondary | ICD-10-CM | POA: Diagnosis present

## 2020-08-28 DIAGNOSIS — N179 Acute kidney failure, unspecified: Secondary | ICD-10-CM | POA: Diagnosis present

## 2020-08-28 DIAGNOSIS — Z853 Personal history of malignant neoplasm of breast: Secondary | ICD-10-CM | POA: Diagnosis not present

## 2020-08-28 DIAGNOSIS — R4189 Other symptoms and signs involving cognitive functions and awareness: Secondary | ICD-10-CM

## 2020-08-28 DIAGNOSIS — Z4659 Encounter for fitting and adjustment of other gastrointestinal appliance and device: Secondary | ICD-10-CM

## 2020-08-28 HISTORY — DX: Legal blindness, as defined in USA: H54.8

## 2020-08-28 LAB — URINALYSIS, COMPLETE (UACMP) WITH MICROSCOPIC
Bilirubin Urine: NEGATIVE
Glucose, UA: >=500 mg/dL — AB
Hgb urine dipstick: NEGATIVE
Ketones, ur: 20 mg/dL — AB
Leukocytes,Ua: NEGATIVE
Nitrite: NEGATIVE
Protein, ur: 30 mg/dL — AB
Specific Gravity, Urine: 1.02 (ref 1.005–1.030)
pH: 5 (ref 5.0–8.0)

## 2020-08-28 LAB — BLOOD GAS, ARTERIAL
Acid-base deficit: 13.7 mmol/L — ABNORMAL HIGH (ref 0.0–2.0)
Bicarbonate: 16.1 mmol/L — ABNORMAL LOW (ref 20.0–28.0)
FIO2: 1
MECHVT: 420 mL
O2 Saturation: 99.7 %
PEEP: 5 cmH2O
Patient temperature: 37
RATE: 16 resp/min
pCO2 arterial: 52 mmHg — ABNORMAL HIGH (ref 32.0–48.0)
pH, Arterial: 7.1 — CL (ref 7.350–7.450)
pO2, Arterial: 249 mmHg — ABNORMAL HIGH (ref 83.0–108.0)

## 2020-08-28 LAB — BASIC METABOLIC PANEL
Anion gap: 20 — ABNORMAL HIGH (ref 5–15)
Anion gap: 22 — ABNORMAL HIGH (ref 5–15)
Anion gap: 17 — ABNORMAL HIGH (ref 5–15)
BUN: 67 mg/dL — ABNORMAL HIGH (ref 8–23)
BUN: 70 mg/dL — ABNORMAL HIGH (ref 8–23)
BUN: 71 mg/dL — ABNORMAL HIGH (ref 8–23)
CO2: 14 mmol/L — ABNORMAL LOW (ref 22–32)
CO2: 16 mmol/L — ABNORMAL LOW (ref 22–32)
CO2: 19 mmol/L — ABNORMAL LOW (ref 22–32)
Calcium: 8.1 mg/dL — ABNORMAL LOW (ref 8.9–10.3)
Calcium: 8.2 mg/dL — ABNORMAL LOW (ref 8.9–10.3)
Calcium: 8.4 mg/dL — ABNORMAL LOW (ref 8.9–10.3)
Chloride: 113 mmol/L — ABNORMAL HIGH (ref 98–111)
Chloride: 114 mmol/L — ABNORMAL HIGH (ref 98–111)
Chloride: 115 mmol/L — ABNORMAL HIGH (ref 98–111)
Creatinine, Ser: 2.58 mg/dL — ABNORMAL HIGH (ref 0.44–1.00)
Creatinine, Ser: 2.63 mg/dL — ABNORMAL HIGH (ref 0.44–1.00)
Creatinine, Ser: 2.26 mg/dL — ABNORMAL HIGH (ref 0.44–1.00)
GFR, Estimated: 16 mL/min — ABNORMAL LOW (ref >60)
GFR, Estimated: 17 mL/min — ABNORMAL LOW (ref >60)
GFR, Estimated: 20 mL/min — ABNORMAL LOW (ref >60)
Glucose, Bld: 545 mg/dL (ref 70–99)
Glucose, Bld: 553 mg/dL (ref 70–99)
Glucose, Bld: 354 mg/dL — ABNORMAL HIGH (ref 70–99)
Potassium: 5.9 mmol/L — ABNORMAL HIGH (ref 3.5–5.1)
Potassium: 5.9 mmol/L — ABNORMAL HIGH (ref 3.5–5.1)
Potassium: 4.5 mmol/L (ref 3.5–5.1)
Sodium: 149 mmol/L — ABNORMAL HIGH (ref 135–145)
Sodium: 150 mmol/L — ABNORMAL HIGH (ref 135–145)
Sodium: 151 mmol/L — ABNORMAL HIGH (ref 135–145)

## 2020-08-28 LAB — BLOOD GAS, VENOUS
Acid-base deficit: 13.7 mmol/L — ABNORMAL HIGH (ref 0.0–2.0)
Bicarbonate: 16.3 mmol/L — ABNORMAL LOW (ref 20.0–28.0)
FIO2: 0.5
MECHVT: 420 mL
O2 Saturation: 92 %
PEEP: 5 cmH2O
RATE: 16 resp/min
pCO2, Ven: 55 mmHg (ref 44.0–60.0)
pH, Ven: 7.08 — CL (ref 7.250–7.430)
pO2, Ven: 68 mmHg — ABNORMAL HIGH (ref 32.0–45.0)

## 2020-08-28 LAB — TROPONIN I (HIGH SENSITIVITY)
Troponin I (High Sensitivity): 98 ng/L — ABNORMAL HIGH (ref <18)
Troponin I (High Sensitivity): 80 ng/L — ABNORMAL HIGH (ref <18)

## 2020-08-28 LAB — CBC WITH DIFFERENTIAL/PLATELET
Abs Immature Granulocytes: 1.25 10*3/uL — ABNORMAL HIGH (ref 0.00–0.07)
Basophils Absolute: 0.1 10*3/uL (ref 0.0–0.1)
Basophils Relative: 0 %
Eosinophils Absolute: 0 10*3/uL (ref 0.0–0.5)
Eosinophils Relative: 0 %
HCT: 43.6 % (ref 36.0–46.0)
Hemoglobin: 13 g/dL (ref 12.0–15.0)
Immature Granulocytes: 4 %
Lymphocytes Relative: 3 %
Lymphs Abs: 0.8 10*3/uL (ref 0.7–4.0)
MCH: 28.6 pg (ref 26.0–34.0)
MCHC: 29.8 g/dL — ABNORMAL LOW (ref 30.0–36.0)
MCV: 96 fL (ref 80.0–100.0)
Monocytes Absolute: 1.9 10*3/uL — ABNORMAL HIGH (ref 0.1–1.0)
Monocytes Relative: 6 %
Neutro Abs: 27.4 10*3/uL — ABNORMAL HIGH (ref 1.7–7.7)
Neutrophils Relative %: 87 %
Platelets: 350 10*3/uL (ref 150–400)
RBC: 4.54 MIL/uL (ref 3.87–5.11)
RDW: 17.2 % — ABNORMAL HIGH (ref 11.5–15.5)
Smear Review: NORMAL
WBC: 31.5 10*3/uL — ABNORMAL HIGH (ref 4.0–10.5)
nRBC: 0.1 % (ref 0.0–0.2)

## 2020-08-28 LAB — URINALYSIS, ROUTINE W REFLEX MICROSCOPIC
Bilirubin Urine: NEGATIVE
Glucose, UA: >=500 mg/dL — AB
Ketones, ur: 20 mg/dL — AB
Leukocytes,Ua: NEGATIVE
Nitrite: NEGATIVE
Protein, ur: 30 mg/dL — AB
Specific Gravity, Urine: 1.02 (ref 1.005–1.030)
pH: 5 (ref 5.0–8.0)

## 2020-08-28 LAB — PROCALCITONIN: Procalcitonin: 1.94 ng/mL

## 2020-08-28 LAB — RESP PANEL BY RT-PCR (FLU A&B, COVID) ARPGX2
Influenza A by PCR: NEGATIVE
Influenza B by PCR: NEGATIVE
SARS Coronavirus 2 by RT PCR: POSITIVE — AB

## 2020-08-28 LAB — CBG MONITORING, ED
Glucose-Capillary: 295 mg/dL — ABNORMAL HIGH (ref 70–99)
Glucose-Capillary: 322 mg/dL — ABNORMAL HIGH (ref 70–99)
Glucose-Capillary: 397 mg/dL — ABNORMAL HIGH (ref 70–99)
Glucose-Capillary: 443 mg/dL — ABNORMAL HIGH (ref 70–99)
Glucose-Capillary: 447 mg/dL — ABNORMAL HIGH (ref 70–99)
Glucose-Capillary: 452 mg/dL — ABNORMAL HIGH (ref 70–99)
Glucose-Capillary: 486 mg/dL — ABNORMAL HIGH (ref 70–99)

## 2020-08-28 LAB — COMPREHENSIVE METABOLIC PANEL
ALT: 14 U/L (ref 0–44)
AST: 20 U/L (ref 15–41)
Albumin: 2.3 g/dL — ABNORMAL LOW (ref 3.5–5.0)
Alkaline Phosphatase: 74 U/L (ref 38–126)
Anion gap: 20 — ABNORMAL HIGH (ref 5–15)
BUN: 69 mg/dL — ABNORMAL HIGH (ref 8–23)
CO2: 17 mmol/L — ABNORMAL LOW (ref 22–32)
Calcium: 8.6 mg/dL — ABNORMAL LOW (ref 8.9–10.3)
Chloride: 113 mmol/L — ABNORMAL HIGH (ref 98–111)
Creatinine, Ser: 2.68 mg/dL — ABNORMAL HIGH (ref 0.44–1.00)
GFR, Estimated: 16 mL/min — ABNORMAL LOW (ref >60)
Glucose, Bld: 567 mg/dL (ref 70–99)
Potassium: 6.1 mmol/L — ABNORMAL HIGH (ref 3.5–5.1)
Sodium: 150 mmol/L — ABNORMAL HIGH (ref 135–145)
Total Bilirubin: 1.5 mg/dL — ABNORMAL HIGH (ref 0.3–1.2)
Total Protein: 6.9 g/dL (ref 6.5–8.1)

## 2020-08-28 LAB — LACTIC ACID, PLASMA: Lactic Acid, Venous: 1.7 mmol/L (ref 0.5–1.9)

## 2020-08-28 LAB — BETA-HYDROXYBUTYRIC ACID: Beta-Hydroxybutyric Acid: 6.95 mmol/L — ABNORMAL HIGH (ref 0.05–0.27)

## 2020-08-28 LAB — PHOSPHORUS: Phosphorus: 7.8 mg/dL — ABNORMAL HIGH (ref 2.5–4.6)

## 2020-08-28 LAB — MAGNESIUM: Magnesium: 2.6 mg/dL — ABNORMAL HIGH (ref 1.7–2.4)

## 2020-08-28 MED ORDER — SODIUM CHLORIDE 0.9 % IV SOLN: Freq: Once | INTRAVENOUS | Status: AC

## 2020-08-28 MED ORDER — SODIUM CHLORIDE 0.9% FLUSH: 3.0000 mL | INTRAVENOUS | Status: DC | PRN

## 2020-08-28 MED ORDER — FAMOTIDINE IN NACL 20-0.9 MG/50ML-% IV SOLN
20.0000 mg | Freq: Two times a day (BID) | INTRAVENOUS | Status: DC
  Administered 2020-08-28: 20 mg via INTRAVENOUS
  Filled 2020-08-28 (×2): qty 50

## 2020-08-28 MED ORDER — DOCUSATE SODIUM 100 MG PO CAPS: 100.0000 mg | ORAL_CAPSULE | Freq: Two times a day (BID) | ORAL | Status: DC | PRN

## 2020-08-28 MED ORDER — SODIUM CHLORIDE 0.9 % IV SOLN: 250.0000 mL | INTRAVENOUS | Status: DC | PRN

## 2020-08-28 MED ORDER — DEXTROSE 50 % IV SOLN: 0.0000 mL | INTRAVENOUS | Status: DC | PRN

## 2020-08-28 MED ORDER — VECURONIUM BROMIDE 10 MG IV SOLR
10.0000 mg | Freq: Once | INTRAVENOUS | Status: AC
  Administered 2020-08-28: 10 mg via INTRAVENOUS

## 2020-08-28 MED ORDER — DEXTROSE IN LACTATED RINGERS 5 % IV SOLN: INTRAVENOUS | Status: DC

## 2020-08-28 MED ORDER — MIDAZOLAM HCL 2 MG/2ML IJ SOLN: 1.0000 mg | INTRAMUSCULAR | Status: DC | PRN

## 2020-08-28 MED ORDER — HEPARIN SODIUM (PORCINE) 5000 UNIT/ML IJ SOLN
5000.0000 [IU] | Freq: Three times a day (TID) | INTRAMUSCULAR | Status: DC
  Administered 2020-08-28 – 2020-08-30 (×5): 5000 [IU] via SUBCUTANEOUS
  Filled 2020-08-28 (×5): qty 1

## 2020-08-28 MED ORDER — ACETAMINOPHEN 325 MG PO TABS: 650.0000 mg | ORAL_TABLET | ORAL | Status: DC | PRN

## 2020-08-28 MED ORDER — FENTANYL CITRATE (PF) 100 MCG/2ML IJ SOLN
25.0000 ug | Freq: Once | INTRAMUSCULAR | Status: DC
Start: 2020-08-28 — End: 2020-08-31

## 2020-08-28 MED ORDER — POLYETHYLENE GLYCOL 3350 17 G PO PACK: 17.0000 g | PACK | Freq: Every day | ORAL | Status: DC | PRN

## 2020-08-28 MED ORDER — LACTATED RINGERS IV BOLUS (SEPSIS)
1000.0000 mL | Freq: Once | INTRAVENOUS | Status: AC
  Administered 2020-08-28: 1000 mL via INTRAVENOUS

## 2020-08-28 MED ORDER — ETOMIDATE 2 MG/ML IV SOLN
5.0000 mg | Freq: Once | INTRAVENOUS | Status: AC
  Administered 2020-08-28: 5 mg via INTRAVENOUS

## 2020-08-28 MED ORDER — SODIUM CHLORIDE 0.9% FLUSH
3.0000 mL | Freq: Two times a day (BID) | INTRAVENOUS | Status: DC
  Administered 2020-08-28 – 2020-08-30 (×4): 3 mL via INTRAVENOUS

## 2020-08-28 MED ORDER — FENTANYL 2500MCG IN NS 250ML (10MCG/ML) PREMIX INFUSION
75.0000 ug/h | INTRAVENOUS | Status: DC
  Administered 2020-08-28: 75 ug/h via INTRAVENOUS
  Filled 2020-08-28: qty 250

## 2020-08-28 MED ORDER — INSULIN REGULAR(HUMAN) IN NACL 100-0.9 UT/100ML-% IV SOLN
INTRAVENOUS | Status: DC
  Administered 2020-08-28: 6.5 [IU]/h via INTRAVENOUS
  Filled 2020-08-28 (×2): qty 100

## 2020-08-28 MED ORDER — FENTANYL 2500MCG IN NS 250ML (10MCG/ML) PREMIX INFUSION
25.0000 ug/h | INTRAVENOUS | Status: DC
  Administered 2020-08-28: 25 ug/h via INTRAVENOUS
  Administered 2020-08-30: 150 ug/h via INTRAVENOUS
  Filled 2020-08-28: qty 250

## 2020-08-28 MED ORDER — FENTANYL BOLUS VIA INFUSION
25.0000 ug | INTRAVENOUS | Status: DC | PRN
  Filled 2020-08-28: qty 25

## 2020-08-28 MED ORDER — DEXAMETHASONE SODIUM PHOSPHATE 10 MG/ML IJ SOLN
6.0000 mg | Freq: Once | INTRAMUSCULAR | Status: AC
  Administered 2020-08-28: 6 mg via INTRAVENOUS
  Filled 2020-08-28: qty 1

## 2020-08-28 MED ORDER — ROCURONIUM BROMIDE 50 MG/5ML IV SOLN: 100.0000 mg | Freq: Once | INTRAVENOUS | Status: DC

## 2020-08-28 MED ORDER — SODIUM CHLORIDE 0.9 % IV SOLN
500.0000 mg | INTRAVENOUS | Status: DC
  Administered 2020-08-28 – 2020-08-29 (×2): 500 mg via INTRAVENOUS
  Filled 2020-08-28 (×3): qty 500

## 2020-08-28 MED ORDER — LACTATED RINGERS IV SOLN: INTRAVENOUS | Status: DC

## 2020-08-28 MED ORDER — ONDANSETRON HCL 4 MG/2ML IJ SOLN: 4.0000 mg | Freq: Four times a day (QID) | INTRAMUSCULAR | Status: DC | PRN

## 2020-08-28 MED ORDER — SODIUM CHLORIDE 0.9 % IV SOLN
2.0000 g | INTRAVENOUS | Status: DC
  Administered 2020-08-28 – 2020-08-29 (×2): 2 g via INTRAVENOUS
  Filled 2020-08-28 (×2): qty 20
  Filled 2020-08-28: qty 2

## 2020-08-28 MED ORDER — DOCUSATE SODIUM 50 MG/5ML PO LIQD
100.0000 mg | Freq: Two times a day (BID) | ORAL | Status: DC
  Administered 2020-08-29 (×2): 100 mg
  Filled 2020-08-28 (×4): qty 10

## 2020-08-28 MED ORDER — NOREPINEPHRINE 4 MG/250ML-% IV SOLN
0.0000 ug/min | INTRAVENOUS | Status: DC
  Administered 2020-08-28: 10 ug/min via INTRAVENOUS
  Administered 2020-08-29 (×2): 2 ug/min via INTRAVENOUS
  Filled 2020-08-28 (×2): qty 250

## 2020-08-28 MED ORDER — FAMOTIDINE IN NACL 20-0.9 MG/50ML-% IV SOLN: 20.0000 mg | Freq: Two times a day (BID) | INTRAVENOUS | Status: DC

## 2020-08-28 MED ORDER — POLYETHYLENE GLYCOL 3350 17 G PO PACK
17.0000 g | PACK | Freq: Every day | ORAL | Status: DC
  Administered 2020-08-29: 17 g
  Filled 2020-08-28: qty 1

## 2020-08-28 NOTE — ED Notes (Signed)
RT at bedside. Pt on zoll monitor.

## 2020-08-28 NOTE — ED Triage Notes (Signed)
Unresponsive from home.

## 2020-08-28 NOTE — Progress Notes (Signed)
Elink following Code Sepsis. 

## 2020-08-28 NOTE — ED Notes (Signed)
Lab stated 0768 labs are duplicates (CBC and Creatinine) and do not need to be drawn again

## 2020-08-28 NOTE — ED Notes (Signed)
As per ER provider, Levophed started on central line

## 2020-08-28 NOTE — ED Provider Notes (Signed)
[facility] Emergency Department Provider Note ____________________________________________   Event Date/Time   First MD Initiated Contact with Patient 09/08/2020 1616     (approximate)  I have reviewed the triage vital signs and the nursing notes.  HISTORY  Chief Complaint Altered Mental Status   HPI RELDA Singleton is a 87 y.o. femalewho presents to the ED for evaluation of unresponsiveness.   Chart review indicates HTN, DM.   Patient presents from home unresponsive via EMS.  EMS reports that fire was first on the scene, noting "high" blood glucose.  CBG 240 with EMS. They start IV and start 500cc NS bolus.   History otherwise limited by acuity and unresponsive patient.    Past Medical History:  Diagnosis Date  . Diabetes mellitus without complication ([place])   . Hypertension     Patient Active Problem List   Diagnosis Date Noted  . Acute lower UTI 08/06/2020  . Acute pyelonephritis 08/05/2020  . Wrist fracture, closed, left, sequela 08/05/2020  . Elevated AST (SGOT) 08/05/2020  . COVID-19 virus infection 08/05/2020  . Pyelonephritis 08/05/2020  . Poorly-controlled hypertension 10/16/2018  . Type 2 diabetes mellitus not at goal [facility]) 10/16/2018  . Bilateral edema of lower extremity 10/03/2018    Past Surgical History:  Procedure Laterality Date  . MASTECTOMY      Prior to Admission medications   Not on File    Allergies Patient has no known allergies.  No family history on file.  Social History Social History   Tobacco Use  . Smoking status: Never Smoker  . Smokeless tobacco: Never Used  Substance Use Topics  . Alcohol use: Never  . Drug use: Never    Review of Systems  Unable to assessed due to unresponsive patient.  ____________________________________________   PHYSICAL EXAM:  VITAL SIGNS: Vitals:   09/15/2020 1750 09/16/2020 1819  BP: 101/82 96/62  Pulse:  (!) 101  Resp: (!) 21 (!) 23  Temp: 97.6 F (36.4 C)  (!) 97 F (36.1 C)  SpO2:  97%      Constitutional: slumped over in bed on NRB. GCS 3.  Eyes: Conjunctivae are normal. Blown left pupil Head: Atraumatic. Nose: No congestion/rhinnorhea. Mouth/Throat: Mucous membranes are dry.  Oropharynx non-erythematous. Neck: No stridor. No cervical spine tenderness to palpation. Cardiovascular: Tachycardic with a faint peripheral pulse Respiratory: Tachypneic on NRB, coarse breath sounds throughout, worse to the right base.  Gastrointestinal: Soft , nondistended. Well healed midline surgical incision.  Musculoskeletal:  No joint effusions. No signs of acute trauma. Neurologic:  GCS 3 Skin:  Skin is warm, dry and intact. No rash noted. Psychiatric: Unable to be assessed. .  ____________________________________________   LABS (all labs ordered are listed, but only abnormal results are displayed)  Labs Reviewed  RESP PANEL BY RT-PCR (FLU A&B, COVID) ARPGX2 - Abnormal; Notable for the following components:      Result Value   SARS Coronavirus 2 by RT PCR POSITIVE (*)    All other components within normal limits  MAGNESIUM - Abnormal; Notable for the following components:   Magnesium 2.6 (*)    All other components within normal limits  URINALYSIS, COMPLETE (UACMP) WITH MICROSCOPIC - Abnormal; Notable for the following components:   Color, Urine YELLOW (*)    APPearance HAZY (*)    Glucose, UA >=500 (*)    Ketones, ur 20 (*)    Protein, ur 30 (*)    Bacteria, UA RARE (*)    All other components  within normal limits  COMPREHENSIVE METABOLIC PANEL - Abnormal; Notable for the following components:   Sodium 150 (*)    Potassium 6.1 (*)    Chloride 113 (*)    CO2 17 (*)    Glucose, Bld 567 (*)    BUN 69 (*)    Creatinine, Ser 2.68 (*)    Calcium 8.6 (*)    Albumin 2.3 (*)    Total Bilirubin 1.5 (*)    GFR, Estimated 16 (*)    Anion gap 20 (*)    All other components within normal limits  CBC WITH DIFFERENTIAL/PLATELET - Abnormal;  Notable for the following components:   WBC 31.5 (*)    MCHC 29.8 (*)    RDW 17.2 (*)    Neutro Abs 27.4 (*)    Monocytes Absolute 1.9 (*)    Abs Immature Granulocytes 1.25 (*)    All other components within normal limits  BLOOD GAS, ARTERIAL - Abnormal; Notable for the following components:   pH, Arterial 7.10 (*)    pCO2 arterial 52 (*)    pO2, Arterial 249 (*)    Bicarbonate 16.1 (*)    Acid-base deficit 13.7 (*)    All other components within normal limits  TROPONIN I (HIGH SENSITIVITY) - Abnormal; Notable for the following components:   Troponin I (High Sensitivity) 98 (*)    All other components within normal limits  CULTURE, BLOOD (SINGLE)  LACTIC ACID, PLASMA  BASIC METABOLIC PANEL  BASIC METABOLIC PANEL  BASIC METABOLIC PANEL  BASIC METABOLIC PANEL  BETA-HYDROXYBUTYRIC ACID  BETA-HYDROXYBUTYRIC ACID  URINALYSIS, ROUTINE W REFLEX MICROSCOPIC  BLOOD GAS, VENOUS  CBG MONITORING, ED  TROPONIN I (HIGH SENSITIVITY)   ____________________________________________  12 Lead EKG  Sinus rhythm, rate of 105 bpm.  Normal axis.  Normal intervals.  Lateral and inferior T wave inversions with some ST depressions to septal and lateral leads.  No STEMI criteria. ____________________________________________  RADIOLOGY  ED MD interpretation: CXR postintubation reviewed by me with ETT in good position, infiltrate to the right middle lung field.  Official radiology report(s): CT Head Wo Contrast  Result Date: 08/29/2020 CLINICAL DATA:  Mental status change.  Unresponsive. EXAM: CT HEAD WITHOUT CONTRAST TECHNIQUE: Contiguous axial images were obtained from the base of the skull through the vertex without intravenous contrast. COMPARISON:  CT head 10/09/2013. FINDINGS: Brain: Cerebral ventricle sizes are concordant with the degree of cerebral volume loss. Patchy and confluent areas of decreased attenuation are noted throughout the deep and periventricular white matter of the cerebral  hemispheres bilaterally, compatible with chronic microvascular ischemic disease. No evidence of large-territorial acute infarction. No parenchymal hemorrhage. No mass lesion. No extra-axial collection. No mass effect or midline shift. No hydrocephalus. Basilar cisterns are patent. Vascular: No hyperdense vessel. Atherosclerotic calcifications are present within the cavernous internal carotid arteries. Skull: No acute fracture or focal lesion. Sinuses/Orbits: Right frontal and ethmoid sinus mucosal thickening. Otherwise remaining paranasal sinuses and mastoid air cells are clear. The orbits are unremarkable. Other: None. IMPRESSION: No acute intracranial abnormality. Electronically Signed   By: Iven Finn M.D.   On: 08/25/2020 18:23   DG Chest Portable 1 View  Result Date: 09/13/2020 CLINICAL DATA:  Status post subclavian line placement. EXAM: PORTABLE CHEST 1 VIEW COMPARISON:  August 28, 2020 FINDINGS: The ETT is in good position. The NG tube terminates in the distal gastric body. A new left central line has been placed terminating near the caval atrial junction. It is possible that this line  extends just into the right side of the atrium. The patient may benefit from withdrawing 1.5 cm. Persistent infiltrate in the right lung. Persistent mild opacity in left base. No pneumothorax. No other changes. IMPRESSION: 1. The new left central line distal tip is near the caval atrial junction. It is possible extends just into the right side of the atrium. The patient may benefit from withdrawing 1.5 cm. No pneumothorax. 2. Other support apparatus as above. 3. Persistent infiltrates in the right mid and lower lung. Persistent mild opacity in left base. Electronically Signed   By: Dorise Bullion III M.D   On: 09-14-20 18:03   DG Chest Portable 1 View  Result Date: 2020/09/14 CLINICAL DATA:  Status post intubation.  Patient unresponsive. EXAM: PORTABLE CHEST 1 VIEW COMPARISON:  August 05, 2020 FINDINGS: The ETT  is in good position. The OG tube terminates below today's film. Infiltrate is seen in the right mid lower lung. Mild opacity in left base could represent infiltrate or atelectasis. The cardiomediastinal silhouette is unremarkable. No pneumothorax. No other acute abnormalities. IMPRESSION: 1. Support apparatus as above.  The ETT is in good position. 2. Infiltrate in the right mid lower lung worrisome for pneumonia or aspiration. Recommend clinical correlation. More mild opacity in left base could represent atelectasis or developing infiltrate. Electronically Signed   By: Dorise Bullion III M.D   On: 09-14-20 16:39    ____________________________________________   PROCEDURES and INTERVENTIONS  Procedure(s) performed (including Critical Care):  .1-3 Lead EKG Interpretation Performed by: Vladimir Crofts, MD Authorized by: Vladimir Crofts, MD     Interpretation: abnormal     ECG rate:  110   ECG rate assessment: tachycardic     Rhythm: sinus tachycardia     Ectopy: none     Conduction: normal   Procedure Name: Intubation Date/Time: 09/14/2020 5:38 PM Performed by: Vladimir Crofts, MD Pre-anesthesia Checklist: Patient identified, Patient being monitored, Emergency Drugs available, Timeout performed and Suction available Oxygen Delivery Method: Non-rebreather mask Preoxygenation: Pre-oxygenation with 100% oxygen Induction Type: Rapid sequence Ventilation: Mask ventilation without difficulty Laryngoscope Size: Glidescope and 3 Grade View: Grade I Tube size: 7.5 mm Number of attempts: 1 Airway Equipment and Method: Rigid stylet Placement Confirmation: ETT inserted through vocal cords under direct vision,  CO2 detector and Breath sounds checked- equal and bilateral Secured at: 21 (gum line) cm Tube secured with: ETT holder Dental Injury: Teeth and Oropharynx as per pre-operative assessment     .Central Line  Date/Time: 2020/09/14 5:38 PM Performed by: Vladimir Crofts, MD Authorized by: Vladimir Crofts, MD   Consent:    Consent obtained:  Emergent situation Pre-procedure details:    Indication(s): central venous access and insufficient peripheral access     Hand hygiene: Hand hygiene performed prior to insertion     Sterile barrier technique: All elements of maximal sterile technique followed     Skin preparation:  Chlorhexidine   Skin preparation agent: Skin preparation agent completely dried prior to procedure   Procedure details:    Location:  L subclavian   Patient position:  Trendelenburg   Procedural supplies:  Triple lumen   Landmarks identified: yes     Ultrasound guidance: no     Number of attempts:  1   Successful placement: yes   Post-procedure details:    Post-procedure:  Dressing applied and line sutured   Assessment:  Blood return through all ports, no pneumothorax on x-ray, free fluid flow and placement verified by x-ray   Procedure  completion:  Tolerated well, no immediate complications .Critical Care Performed by: Vladimir Crofts, MD Authorized by: Vladimir Crofts, MD   Critical care provider statement:    Critical care time (minutes):  45   Critical care was necessary to treat or prevent imminent or life-threatening deterioration of the following conditions:  Shock, respiratory failure and CNS failure or compromise   Critical care was time spent personally by me on the following activities:  Discussions with consultants, evaluation of patient's response to treatment, examination of patient, ordering and performing treatments and interventions, ordering and review of laboratory studies, ordering and review of radiographic studies, pulse oximetry, Debbie-evaluation of patient's condition, obtaining history from patient or surrogate and review of old charts    Medications  norepinephrine (LEVOPHED) 4mg  in 232mL premix infusion (10 mcg/min Intravenous New Bag/Given 09/12/2020 1609)  cefTRIAXone (ROCEPHIN) 2 g in sodium chloride 0.9 % 100 mL IVPB (0 g Intravenous Stopped  09/03/2020 1745)  azithromycin (ZITHROMAX) 500 mg in sodium chloride 0.9 % 250 mL IVPB (500 mg Intravenous New Bag/Given 08/27/2020 1749)  fentaNYL 2575mcg in NS 223mL (74mcg/ml) infusion-PREMIX (75 mcg/hr Intravenous New Bag/Given 09/12/2020 1738)  insulin regular, human (MYXREDLIN) 100 units/ 100 mL infusion (has no administration in time range)  lactated ringers infusion (has no administration in time range)  dextrose 5 % in lactated ringers infusion (has no administration in time range)  dextrose 50 % solution 0-50 mL (has no administration in time range)  0.9 %  sodium chloride infusion ( Intravenous Stopped 09/19/2020 1727)  etomidate (AMIDATE) injection 5 mg (5 mg Intravenous Given 08/27/2020 1612)  vecuronium (NORCURON) injection 10 mg (10 mg Intravenous Given 09/19/2020 1612)  lactated ringers bolus 1,000 mL (1,000 mLs Intravenous New Bag/Given 09/16/2020 1728)    ____________________________________________   MDM / ED COURSE   87 year old woman with history of diabetes not on medication presents to the ED unresponsive, found to be Covid positive with evidence of RML pneumonia and DKA, requiring ICU admission.  Patient tachycardic and hypotensive on arrival, unresponsive necessitating endotracheal intubation.  Further requiring 10 mics per minute of Levophed to maintain normotensive pressures.  CXR with RML pneumonia, for which she was started on sepsis protocol antibiotics.  Also returns Covid positive, for which she will receive Decadron.  Blood work with stigmata of DKA, so we will initiate insulin drip as well.  Will admit to ICU for further work-up management.   Clinical Course as of 09/06/2020 1836  Fri Aug 28, 2020  1600 I spoke with patient's son, Debbie Singleton, who indicates that patient never signs a DNR or other forms. He requests full code  [DS]  [id number] Intubated, 21 at the gum line. On Levophed 49mcg/min [DS]  [id number] Nurse unable to acquire blood after sticking about 4 times.  Due to patient being on  persistent 10 mcg of Levophed, I finished placing central line and acquired blood for the nurse. [DS]    Clinical Course User Index [DS] Vladimir Crofts, MD    ____________________________________________   FINAL CLINICAL IMPRESSION(S) / ED DIAGNOSES  Final diagnoses:  Unresponsive  Acute respiratory failure with hypoxia ([place])  Shock ([place])  Diabetic ketoacidosis with coma associated with type 2 diabetes mellitus [facility])     ED Discharge Orders    None       Jordan Pardini   Note:  This document was prepared using Dragon voice recognition software and may include unintentional dictation errors.   Vladimir Crofts, MD 09/05/2020 Bosie Helper

## 2020-08-28 NOTE — ED Notes (Signed)
ICU provider notified of a temperature of 95.8. RN asked if we are doing any interventions for this at this time. Provider stated to place a bear hugger

## 2020-08-28 NOTE — ED Notes (Signed)
Provider notified that EKG should be exported into the pt chart.

## 2020-08-28 NOTE — ED Notes (Signed)
Date and time results received: 09-02-20 2114 (use smartphrase ".now" to insert current time)  Test: Glucose Critical Value: 545  Name of Provider Notified: ICU Provider, Lea  Orders Received? Or Actions Taken?: given via secure chat, as pt is on insulin gtt

## 2020-08-28 NOTE — ED Notes (Signed)
Pt placed on bear hugger at [facility]

## 2020-08-28 NOTE — ED Notes (Signed)
ICU provider at bedside

## 2020-08-28 NOTE — Progress Notes (Signed)
Pt was transported to room 2 from room 47 while on the vent. The pt was transported to CT and back to the ED while on the vent.

## 2020-08-28 NOTE — ED Notes (Signed)
Date and time results received: 08/29/2020 1820 (use smartphrase ".now" to insert current time)  Test: Glucose Critical Value: 567  Name of Provider Notified: Dr. Tamala Julian  Orders Received? Or Actions Taken?: provider notified

## 2020-08-28 NOTE — Consult Note (Signed)
CODE SEPSIS - PHARMACY COMMUNICATION  **Broad Spectrum Antibiotics should be administered within 1 hour of Sepsis diagnosis**  Time Code Sepsis Called/Page Received: 1645  Antibiotics Ordered: 1645  Time of 1st antibiotic administration: 1709  Additional action taken by pharmacy: n/a  If necessary, Name of Provider/Nurse Contacted: n/a    Lorna Dibble ,PharmD Clinical Pharmacist  08/24/2020  4:46 PM

## 2020-08-28 NOTE — ED Notes (Signed)
Pt intubated this time by Vladimir Crofts Md. ET tube size 7.5, 21 at gumline on the right. Placement verified by color change and auscultation.

## 2020-08-28 NOTE — ED Notes (Signed)
ICU provider at bedside. Pt placed on low intermittent suction via OG tube, as per ICU provider

## 2020-08-28 NOTE — ED Notes (Signed)
Bear hugger turned down to 32 degrees C

## 2020-08-28 NOTE — H&P (Signed)
NAME:  Debbie Singleton, MRN:  [id number], DOB:  June 23, 1925, LOS: 0 ADMISSION DATE:  08/30/2020, CONSULTATION DATE:  09/04/2020 REFERRING MD: Dr. Charlsie Singleton, CHIEF COMPLAINT:  Altered Mental Status  Brief History:  60 yod F COVID + admitted with altered mental status requiring emergent intubation and mechanical ventilation found to be in DKA.  History of Present Illness:  87 year old female presents from home unresponsive via EMS.  EMS reported to the ED that her blood glucose was reading "high" on the scene.  500 mL NS bolus given in the field.  ED provider spoke with the patient's son, Debbie Singleton, confirmed that the patient is a full code and that he wanted to move forward with all interventions.  Patient was intubated in the ED for airway protection in the setting of Covid pneumonia and diabetic ketoacidosis. Initial vitals: T-36.3 , RR-28, HR-109 , BP- 90/62 (66) Labs significant for: UA positive for ketones & protein, Na+-150, K+-6.1, serum CO2- 17, BUN-69, Cr-2.68, troponin-98, lactic 1.7, PCT-1.94, leukocytosis- 31.5, glucose-567, beta hydroxy-6.95, ABG: 7.10/52/249/16.1. Recent admission 08/05/2020-08/07/2020 for urinary tract infection & asymptomatic Covid infection. Past Medical History:  Hypertension Diabetes mellitus poorly controlled Breast cancer with left mastectomy - 2009 Legally blind 6 to 7 years  Significant Hospital Events:  08/29/19-unresponsive in ED > intubated for airway protection > admit to ICU in DKA  Consults:    Procedures:  09/19/2020 ETT >> 09/06/2020 CVC 3L >>  Significant Diagnostic Tests:  09/15/2020 CT head wo contrast >> no acute intracranial abnormality  Micro Data:  08/22/2020 COVID-19 >> positive 09/11/2020 Influenza A/B >> negative 08/24/2020 BC x 1 >> 08/29/20 Tracheal aspirate >>  Antimicrobials:  09/16/2020 azithromycin >> 09/10/2020 ceftriaxone >>  Interim History / Subjective:  Patient is intubated and sedated.  VBG showing worsening metabolic acidosis, ventilator  settings adjusted.  Labs/ Imaging personally reviewed Na+/ K+: 150/5.9 BUN/Cr.:  70/2.58 Hgb: 13.0  WBC/ TMAX: 31.5/afebrile Lactic/ PCT: 1.7/1.94  ABG: 7.10/52/249/16.1 CXR 09/09/2020: Lines and tubes are stable, no pneumothorax post line placement, persistent infiltrates in the right mid and lower lung & persistent mild opacity in the left base. Objective   Blood pressure 117/63, pulse 97, temperature (!) 95.8 F (35.4 C), resp. rate (!) 21, height 5' (1.524 m), weight 71 kg, SpO2 98 %.    Vent Mode: AC FiO2 (%):  [50 %-100 %] 50 % Set Rate:  [16 bmp] 16 bmp Vt Set:  [420 mL] 420 mL PEEP:  [5 cmH20] 5 cmH20   Intake/Output Summary (Last 24 hours) at 09/08/2020 2022 Last data filed at 09/03/2020 1947 Gross per 24 hour  Intake 1508.78 ml  Output --  Net 1508.78 ml   Filed Weights   08/30/2020 1619  Weight: 71 kg    Examination: General: Adult female, critically ill, lying in bed intubated & sedated requiring mechanical ventilation HEENT: MM pink/moist, anicteric, atraumatic, neck supple Neuro: Unresponsive, unable to follow commands, pupils abnormal: L #1 oval and sluggish/ right #5 and non reactive-PMHx of blindness CV: s1s2 RRR, ST on monitor, no r/m/g Pulm: Regular, non labored on PRVC: 60% & PEEP 5, breath sounds diminished throughout GI: soft, rounded, bs x 4 GU: foley in place with clear yellow urine Skin: Limited exam -scattered ecchymosis no rashes/lesions noted Extremities: warm/dry, pulses + 2 R/P, trace edema noted  Resolved Hospital Problem list     Assessment & Plan:  Acute Hypoxic Respiratory Failure secondary to COVID-19 RML bacterial Pneumonia  Recent Covid positive test from previous admission  on 08/05/2020, patient received monoclonal antibodies x 1 that admission but was asymptomatic.  Patient unresponsive on arrival to ED and was intubated for airway protection. CXR shows new RML pneumonia. - Ventilator settings: PRVC  7 mL/kg, FiO2: 60%, PEEP 5, continue  lung protective strategies. Ventilator settings adjusted to allow for compensation of metabolic acidosis - Wean PEEP & FiO2 as tolerated to maintain O2 sats >88% - Goal plateau pressure < 30, driving pressure < 15 - Consider continuous paralytics if needed for vent synchrony/gas exchange - Consider prone positioning if P/F ratio < 150 for oxygenation - PAD protocol, goal RASS -1: Fentanyl drip & Midazolam IVP - Vecuronium PRN for ventilator asynchrony - Diuresis as tolerated - VAP prevention order set - No Remdesivir as now 23 days from initial infection - Received one dose of decadron, start methlyprednisolone 40 mg BID, followed by a taper - Follow inflammatory markers - continue Vitamin C & zinc - f/u cultures, monitor WBC/ fever curve, trend PCT - Daily CXR & ABG PRN - continue ceftriaxone & azithromycin -Continue Levophed drip PRN to maintain MAP > 65  Diabetic ketoacidosis  PMHx: Poorly controlled type 2 diabetes mellitus pH 7.10, CO2 , Anion Gap 14, Metal status- unresponsive metting criteria for Moderate - Severe DKA  serum glucose: 22 Anion gap:5.9 Potassium:5.9 Occult infection vs complinace issues: has not seen a PCP MD since 09/2018, unclear what medications she is taking at home. Recent A1c from December 2021 admission was 9.1. - DKA protocol initiated - Aggressive IV hydration, when blood glucose falls below 250 add D5 to IV fluids - Insulin drip ordered per Endo tool protocol - Strict I/O's: alert provider if UOP < 0.5 mL/kg/hr - Q 4 BMP, closely monitor potassium, replace electrolytes PRN - monitor blood glucose every 1 hour, per Endo tool protocol - trend serum CO2/ AG, f/u A1C - Diabetes coordinator consult  Acute Kidney Injury in the setting of DKA & COVID-19 Baseline creatinine- 1, Cr on admission 2.68 - Strict I/O's: alert provider if UOP < 0.5 mL/kg/hr - IVF hydration per DKA protocol - BMP per DKA protocol, replace electrolytes PRN - Avoid nephrotoxic agents  as able, ensure adequate renal perfusion - Consult nephrology if iHD or CRRT indicated  - consider renal US  Acute encephalopathy in the setting of metabolic acidosis due to DKA Patient unresponsive on arrival to ED, intubated for airway protection. CT head was negative for any acute intracranial abnormality. Patient hypernatremic- 150, serum CO2 14, pH- 7.08 s/p intubation. Active infection-CXR confirms RML pneumonia. Patient lives at home, baseline appears to be independent of ADLs-anticipating improvement in orientation as metabolic acidosis is corrected. -Neurochecks every 4 hours -Follow DKA protocol to correct metabolic acidosis as discussed above -Avoid sedating meds as tolerated -Trend BMP & VBG/ABG to monitor metabolic acidosis -Continue supportive care  Hypertension Patient has not seen a PCP since 09/2018, unclear if she is taking any medications outpatient. Was documented at last PCP visit to have poorly controlled hypertension, however during previous admission in December 2021 BP was ranging 120's/40's - 150's/60's without blood pressure medication. -Patient currently hypotensive on Levophed drip -Continue to monitor  Best practice (evaluated daily)  Diet: NPO Pain/Anxiety/Delirium protocol (if indicated): Fentanyl VAP protocol (if indicated): established DVT prophylaxis: heparin SQ GI prophylaxis: protonix QD Glucose control: monitor Q 4 Mobility: bedrest, mobilize as tolerated Disposition: ICU  Goals of Care:  Last date of multidisciplinary goals of care discussion:09/19/2020 Family and staff present: ED MD & son- Kashawna Manzer  Summary of discussion: continue with full interventions including intubation Follow up goals of care discussion due: 08/29/2020 Code Status: FULL  Labs   CBC: Recent Labs  Lab 09/06/2020 1721  WBC 31.5*  NEUTROABS 27.4*  HGB 13.0  HCT 43.6  MCV 96.0  PLT 725    Basic Metabolic Panel: Recent Labs  Lab 09/11/2020 1721 09/11/2020 1850  NA  150* 149*  K 6.1* 5.9*  CL 113* 113*  CO2 17* 16*  GLUCOSE 567* 553*  BUN 69* 67*  CREATININE 2.68* 2.63*  CALCIUM 8.6* 8.1*  MG 2.6*  --    GFR: Estimated Creatinine Clearance: 11.8 mL/min (A) (by C-G formula based on SCr of 2.63 mg/dL (H)). Recent Labs  Lab 09/03/2020 1721  WBC 31.5*  LATICACIDVEN 1.7    Liver Function Tests: Recent Labs  Lab 09/20/2020 1721  AST 20  ALT 14  ALKPHOS 74  BILITOT 1.5*  PROT 6.9  ALBUMIN 2.3*   No results for input(s): LIPASE, AMYLASE in the last 168 hours. No results for input(s): AMMONIA in the last 168 hours.  ABG    Component Value Date/Time   PHART 7.10 (LL) 09/01/2020 1619   PCO2ART 52 (H) 09/02/2020 1619   PO2ART 249 (H) 09/05/2020 1619   HCO3 16.1 (L) 09/05/2020 1619   ACIDBASEDEF 13.7 (H) 08/24/2020 1619   O2SAT 99.7 09/10/2020 1619     Coagulation Profile: No results for input(s): INR, PROTIME in the last 168 hours.  Cardiac Enzymes: No results for input(s): CKTOTAL, CKMB, CKMBINDEX, TROPONINI in the last 168 hours.  HbA1C: Hgb A1c MFr Bld  Date/Time Value Ref Range Status  08/06/2020 05:37 AM 9.1 (H) 4.8 - 5.6 % Final    Comment:    (NOTE) Pre diabetes:          5.7%-6.4%  Diabetes:              >6.4%  Glycemic control for   <7.0% adults with diabetes     CBG: No results for input(s): GLUCAP in the last 168 hours.  Review of Systems:   UTA- pt intubated & sedated  Past Medical History:  She,  has a past medical history of Diabetes mellitus without complication ([place]) and Hypertension.   Surgical History:   Past Surgical History:  Procedure Laterality Date  . MASTECTOMY       Social History:   reports that she has never smoked. She has never used smokeless tobacco. She reports that she does not drink alcohol and does not use drugs.   Family History:  Her family history is not on file.   Allergies No Known Allergies   Home Medications  Prior to Admission medications   Not on File      Critical care time: 55 minutes       Venetia Night, AGACNP-BC Acute Care Nurse Practitioner [place]   [phone] / [phone] Please see Amion for pager details.

## 2020-08-28 NOTE — ED Notes (Signed)
ER provider notified that pt was positive for covid-19, as per lab that just called this rn

## 2020-08-28 NOTE — ED Notes (Signed)
Pt was brought over from flex, intubated, og tube, foley catheter. Pt on cardiac, bp and pulse ox monitor. Pt has bilateral 20g IVs to the ACs. RN attempted for a 3rd IV and blood work but was unable to obtain either. Catheters intact bleeding controlled. ER provider notified and at bedside to attempt a central line placement.

## 2020-08-29 ENCOUNTER — Inpatient Hospital Stay: Payer: Medicare Other

## 2020-08-29 ENCOUNTER — Encounter: Payer: Self-pay | Admitting: Internal Medicine

## 2020-08-29 DIAGNOSIS — U071 COVID-19: Secondary | ICD-10-CM | POA: Diagnosis not present

## 2020-08-29 DIAGNOSIS — E0811 Diabetes mellitus due to underlying condition with ketoacidosis with coma: Secondary | ICD-10-CM

## 2020-08-29 DIAGNOSIS — J9601 Acute respiratory failure with hypoxia: Secondary | ICD-10-CM | POA: Diagnosis not present

## 2020-08-29 LAB — BASIC METABOLIC PANEL
Anion gap: 14 (ref 5–15)
BUN: 70 mg/dL — ABNORMAL HIGH (ref 8–23)
CO2: 21 mmol/L — ABNORMAL LOW (ref 22–32)
Calcium: 8.4 mg/dL — ABNORMAL LOW (ref 8.9–10.3)
Chloride: 116 mmol/L — ABNORMAL HIGH (ref 98–111)
Creatinine, Ser: 1.99 mg/dL — ABNORMAL HIGH (ref 0.44–1.00)
GFR, Estimated: 23 mL/min — ABNORMAL LOW (ref >60)
Glucose, Bld: 223 mg/dL — ABNORMAL HIGH (ref 70–99)
Potassium: 4.3 mmol/L (ref 3.5–5.1)
Sodium: 151 mmol/L — ABNORMAL HIGH (ref 135–145)

## 2020-08-29 LAB — CBC
HCT: 41.4 % (ref 36.0–46.0)
Hemoglobin: 12.9 g/dL (ref 12.0–15.0)
MCH: 28.9 pg (ref 26.0–34.0)
MCHC: 31.2 g/dL (ref 30.0–36.0)
MCV: 92.8 fL (ref 80.0–100.0)
Platelets: 280 10*3/uL (ref 150–400)
RBC: 4.46 MIL/uL (ref 3.87–5.11)
RDW: 16.7 % — ABNORMAL HIGH (ref 11.5–15.5)
WBC: 31.2 10*3/uL — ABNORMAL HIGH (ref 4.0–10.5)
nRBC: 0.1 % (ref 0.0–0.2)

## 2020-08-29 LAB — GLUCOSE, CAPILLARY
Glucose-Capillary: 106 mg/dL — ABNORMAL HIGH (ref 70–99)
Glucose-Capillary: 117 mg/dL — ABNORMAL HIGH (ref 70–99)
Glucose-Capillary: 121 mg/dL — ABNORMAL HIGH (ref 70–99)
Glucose-Capillary: 127 mg/dL — ABNORMAL HIGH (ref 70–99)
Glucose-Capillary: 146 mg/dL — ABNORMAL HIGH (ref 70–99)
Glucose-Capillary: 150 mg/dL — ABNORMAL HIGH (ref 70–99)
Glucose-Capillary: 150 mg/dL — ABNORMAL HIGH (ref 70–99)
Glucose-Capillary: 165 mg/dL — ABNORMAL HIGH (ref 70–99)
Glucose-Capillary: 178 mg/dL — ABNORMAL HIGH (ref 70–99)
Glucose-Capillary: 185 mg/dL — ABNORMAL HIGH (ref 70–99)
Glucose-Capillary: 204 mg/dL — ABNORMAL HIGH (ref 70–99)

## 2020-08-29 LAB — C-REACTIVE PROTEIN: CRP: 18.1 mg/dL — ABNORMAL HIGH (ref <1.0)

## 2020-08-29 LAB — HEMOGLOBIN A1C
Hgb A1c MFr Bld: 9.8 % — ABNORMAL HIGH (ref 4.8–5.6)
Mean Plasma Glucose: 234.56 mg/dL

## 2020-08-29 LAB — BLOOD GAS, ARTERIAL
Acid-base deficit: 0.2 mmol/L (ref 0.0–2.0)
Bicarbonate: 24.8 mmol/L (ref 20.0–28.0)
FIO2: 0.6
MECHVT: 420 mL
O2 Saturation: 99.6 %
PEEP: 5 cmH2O
Patient temperature: 37
RATE: 20 resp/min
pCO2 arterial: 41 mmHg (ref 32.0–48.0)
pH, Arterial: 7.39 (ref 7.350–7.450)
pO2, Arterial: 187 mmHg — ABNORMAL HIGH (ref 83.0–108.0)

## 2020-08-29 LAB — LACTATE DEHYDROGENASE: LDH: 242 U/L — ABNORMAL HIGH (ref 98–192)

## 2020-08-29 LAB — CBG MONITORING, ED: Glucose-Capillary: 214 mg/dL — ABNORMAL HIGH (ref 70–99)

## 2020-08-29 LAB — FERRITIN: Ferritin: 974 ng/mL — ABNORMAL HIGH (ref 11–307)

## 2020-08-29 LAB — PROCALCITONIN: Procalcitonin: 2.45 ng/mL

## 2020-08-29 LAB — BETA-HYDROXYBUTYRIC ACID: Beta-Hydroxybutyric Acid: 0.43 mmol/L — ABNORMAL HIGH (ref 0.05–0.27)

## 2020-08-29 LAB — MRSA PCR SCREENING: MRSA by PCR: NEGATIVE

## 2020-08-29 LAB — D-DIMER, QUANTITATIVE: D-Dimer, Quant: >20 ug/mL-FEU — ABNORMAL HIGH (ref 0.00–0.50)

## 2020-08-29 MED ORDER — ORAL CARE MOUTH RINSE
15.0000 mL | OROMUCOSAL | Status: DC
  Administered 2020-08-29 – 2020-08-30 (×7): 15 mL via OROMUCOSAL

## 2020-08-29 MED ORDER — CHLORHEXIDINE GLUCONATE 0.12% ORAL RINSE (MEDLINE KIT)
15.0000 mL | Freq: Two times a day (BID) | OROMUCOSAL | Status: DC
  Administered 2020-08-29 – 2020-08-30 (×3): 15 mL via OROMUCOSAL

## 2020-08-29 MED ORDER — INSULIN ASPART 100 UNIT/ML ~~LOC~~ SOLN
0.0000 [IU] | SUBCUTANEOUS | Status: DC
  Administered 2020-08-29 (×3): 2 [IU] via SUBCUTANEOUS
  Administered 2020-08-30: 3 [IU] via SUBCUTANEOUS
  Administered 2020-08-30: 2 [IU] via SUBCUTANEOUS
  Filled 2020-08-29 (×4): qty 1

## 2020-08-29 MED ORDER — PANTOPRAZOLE SODIUM 40 MG IV SOLR
40.0000 mg | INTRAVENOUS | Status: DC
  Administered 2020-08-29 – 2020-08-30 (×2): 40 mg via INTRAVENOUS
  Filled 2020-08-29 (×2): qty 40

## 2020-08-29 MED ORDER — LACTATED RINGERS IV BOLUS: 1000.0000 mL | Freq: Once | INTRAVENOUS | Status: DC

## 2020-08-29 MED ORDER — FREE WATER: 100.0000 mL | Status: DC

## 2020-08-29 MED ORDER — CHLORHEXIDINE GLUCONATE CLOTH 2 % EX PADS: 6.0000 | MEDICATED_PAD | Freq: Every day | CUTANEOUS | Status: DC

## 2020-08-29 MED ORDER — INSULIN DETEMIR 100 UNIT/ML ~~LOC~~ SOLN
0.3000 [IU]/kg | SUBCUTANEOUS | Status: DC
  Administered 2020-08-29: 16 [IU] via SUBCUTANEOUS
  Filled 2020-08-29 (×2): qty 0.16

## 2020-08-29 MED ORDER — LACTATED RINGERS IV SOLN: INTRAVENOUS | Status: DC

## 2020-08-29 MED ORDER — METHYLPREDNISOLONE SODIUM SUCC 40 MG IJ SOLR
40.0000 mg | Freq: Two times a day (BID) | INTRAMUSCULAR | Status: DC
  Administered 2020-08-29 – 2020-08-30 (×3): 40 mg via INTRAVENOUS
  Filled 2020-08-29 (×3): qty 1

## 2020-08-29 MED ORDER — LACTATED RINGERS IV SOLN: INTRAVENOUS | Status: AC

## 2020-08-29 NOTE — Plan of Care (Signed)
  Problem: Clinical Measurements: Goal: Respiratory complications will improve Outcome: Progressing   Problem: Pain Managment: Goal: General experience of comfort will improve Outcome: Progressing   Problem: Respiratory: Goal: Will maintain a patent airway Outcome: Progressing Goal: Complications related to the disease process, condition or treatment will be avoided or minimized Outcome: Progressing   Problem: Respiratory: Goal: Ability to maintain a clear airway and adequate ventilation will improve Outcome: Progressing

## 2020-08-29 NOTE — Progress Notes (Signed)
Inpatient Diabetes Program Recommendations  AACE/ADA: New Consensus Statement on Inpatient Glycemic Control (2015)  Target Ranges:  Prepandial:   less than 140 mg/dL      Peak postprandial:   less than 180 mg/dL (1-2 hours)      Critically ill patients:  140 - 180 mg/dL   Lab Results  Component Value Date   GLUCAP 146 (H) 08/29/2020   HGBA1C 9.1 (H) 08/06/2020    Review of Glycemic Control Results for Debbie Singleton, Debbie Singleton (MRN [id number]) as of 08/29/2020 08:53  Ref. Range 08/29/2020 03:23 08/29/2020 04:17 08/29/2020 05:23 08/29/2020 06:49 08/29/2020 07:45  Glucose-Capillary Latest Ref Range: 70 - 99 mg/dL 204 (H) 165 (H) 150 (H) 178 (H) 146 (H)   Diabetes history: DM2 Outpatient Diabetes medications: No meds Current orders for Inpatient glycemic control: Levemir 16 units qd + Novolog 0-15 q 4 hrs. + Solumedrol 40 mg q 12 hrs.  Inpatient Diabetes Program Recommendations:   Will review CBG trend. May need decrease in basal insulin. -Decrease Novolog correction to sensitive Will follow during hospitalization.  Thank you, Nani Gasser. Severa Jeremiah, RN, MSN, CDE  Diabetes Coordinator Inpatient Glycemic Control Team Team Pager [phone] (8am-5pm) 08/29/2020 8:53 AM

## 2020-08-29 NOTE — Progress Notes (Signed)
Neuro: patient only responds to oral care with a grimace, she does not move to any other stimulus Resp: stable on vent settings CV: remains on bair huggar for temperature regulation, edema to all extremities, vitals signs fairly stable GIGU: foley in place, OG in place-clamped, no bowel movement, no emesis Skin: clean and dry, 2 small (pencil eraser sized) stage 2 wounds to her sacral area, foam applied, otherwise intact Social: The patient is obviously very loved and has a large support system within her family and church community.   Spoke with son, Karle Starch this AM regarding pt's baseline, he provided the history of her life from the past couple months.  He stated "she told me she did not want to be revived." He expressed that he does not want his mother to be resuscitated if her heart became too weak. He expressed that he would reach out to his siblings to discuss their plan/wishes for her care.   Spoke with son, Gwyndolyn Saxon this AM. He reiterated what Karle Starch had expressed. He spoke with Dr. Mortimer Fries to confirm the family's decisions to move to comfort care tomorrow and make the patient a DNR.  Gwyndolyn Saxon (son) and Jacqlyn Larsen (daughter), the eldest children, visited this afternoon, all questions and concerns addressed.   Alvie Heidelberg (granddaughter) and Ardelia Mems (great granddaughter) visited this evening, sang to the patient and shared memories and their feelings toward the patient. All questions and concerns addressed.   Per Dr. Zoila Shutter conversation with the patient's children, there will be no further escalation in care, no pressors administered, patient is a DNR. Family will visit tomorrow and then will be placed in comfort care.

## 2020-08-29 NOTE — Progress Notes (Signed)
CRITICAL CARE NOTE SYNOPSIS  87 yod F COVID + admitted with altered mental status requiring emergent intubation and mechanical ventilation found to be in DKA. - presents from home unresponsive via EMS.  EMS reported to the Patient was intubated in the ED for airway protection in the setting of Covid pneumonia and diabetic ketoacidosis. Recent admission 08/05/2020-08/07/2020 for urinary tract infection & asymptomatic Covid infection. Past Medical History:  Hypertension Diabetes mellitus poorly controlled Breast cancer with left mastectomy - 2009 Legally blind 6 to 7 years  Significant Hospital Events:  08/29/19-unresponsive in ED > intubated for airway protection > admit to ICU in DKA 09/12/2020 ETT >> 09/03/2020 CVC 3L >> 1/8 severe respiratory failure, remains Full CODE  Significant Diagnostic Tests:  09/10/2020 CT head wo contrast >> no acute intracranial abnormality  Micro Data:  09/01/2020 COVID-19 >> positive 09/05/2020 Influenza A/B >> negative 08/30/2020 BC x 1 >> 08/29/20 Tracheal aspirate >>  Antimicrobials:  09/19/2020 azithromycin >> 09/21/2020 ceftriaxone >>    CC  follow up respiratory failure  SUBJECTIVE Patient remains critically ill Prognosis is guarded  Vent Mode: PRVC FiO2 (%):  [40 %-100 %] 40 % Set Rate:  [16 bmp-20 bmp] 20 bmp Vt Set:  [380 mL-420 mL] 380 mL PEEP:  [5 cmH20] 5 cmH20  CBC    Component Value Date/Time   WBC 31.2 (H) 08/29/2020 0326   RBC 4.46 08/29/2020 0326   HGB 12.9 08/29/2020 0326   HGB 11.9 (L) 10/09/2013 2312   HGB 13.9 03/09/2011 1558   HCT 41.4 08/29/2020 0326   HCT 34.6 (L) 10/09/2013 2312   HCT 42.0 03/09/2011 1558   PLT 280 08/29/2020 0326   PLT 348 10/09/2013 2312   PLT 340 03/09/2011 1558   MCV 92.8 08/29/2020 0326   MCV 89 10/09/2013 2312   MCV 86.9 03/09/2011 1558   MCH 28.9 08/29/2020 0326   MCHC 31.2 08/29/2020 0326   RDW 16.7 (H) 08/29/2020 0326   RDW 14.3 10/09/2013 2312   RDW 16.2 (H) 03/09/2011 1558   LYMPHSABS 0.8  09/05/2020 1721   LYMPHSABS 1.1 10/09/2013 2312   LYMPHSABS 2.6 03/09/2011 1558   MONOABS 1.9 (H) 09/17/2020 1721   MONOABS 0.7 10/09/2013 2312   MONOABS 0.7 03/09/2011 1558   EOSABS 0.0 09/18/2020 1721   EOSABS 0.1 10/09/2013 2312   BASOSABS 0.1 09/09/2020 1721   BASOSABS 0.0 10/09/2013 2312   BASOSABS 0.0 03/09/2011 1558   BMP Latest Ref Rng & Units 08/29/2020 09/16/2020 09/09/2020  Glucose 70 - 99 mg/dL 223(H) 354(H) 545(HH)  BUN 8 - 23 mg/dL 70(H) 71(H) 70(H)  Creatinine 0.44 - 1.00 mg/dL 1.99(H) 2.26(H) 2.58(H)  Sodium 135 - 145 mmol/L 151(H) 151(H) 150(H)  Potassium 3.5 - 5.1 mmol/L 4.3 4.5 5.9(H)  Chloride 98 - 111 mmol/L 116(H) 115(H) 114(H)  CO2 22 - 32 mmol/L 21(L) 19(L) 14(L)  Calcium 8.9 - 10.3 mg/dL 8.4(L) 8.4(L) 8.2(L)    BP (!) 110/56   Pulse 98   Temp 97.88 F (36.6 C)   Resp (!) 24   Ht 5' (1.524 m)   Wt 63.3 kg   SpO2 100%   BMI 27.25 kg/m    I/O last 3 completed shifts: In: 3564 [I.V.:2214; IV Piggyback:1350] Out: 275 [Urine:275] No intake/output data recorded.  SpO2: 100 % FiO2 (%): (S) 40 %  Estimated body mass index is 27.25 kg/m as calculated from the following:   Height as of this encounter: 5' (1.524 m).   Weight as of this encounter: 63.3  kg.  SIGNIFICANT EVENTS   REVIEW OF SYSTEMS  PATIENT IS UNABLE TO PROVIDE COMPLETE REVIEW OF SYSTEMS DUE TO SEVERE CRITICAL ILLNESS      COVID-19 DISASTER DECLARATION:   FULL CONTACT PHYSICAL EXAMINATION WAS NOT POSSIBLE DUE TO TREATMENT OF COVID-19  AND CONSERVATION OF PERSONAL PROTECTIVE EQUIPMENT, LIMITED EXAM FINDINGS INCLUDE-   PHYSICAL EXAMINATION:  GENERAL:critically ill appearing, +resp distress NEUROLOGIC: obtunded, GCS<8   Patient assessed or the symptoms described in the history of present illness.  In the context of the Global COVID-19 pandemic, which necessitated consideration that the patient might be at risk for infection with the SARS-CoV-2 virus that causes COVID-19,  Institutional protocols and algorithms that pertain to the evaluation of patients at risk for COVID-19 are in a state of rapid change based on information released by regulatory bodies including the CDC and federal and state organizations. These policies and algorithms were followed during the patient's care while in hospital.    MEDICATIONS: I have reviewed all medications and confirmed regimen as documented   CULTURE RESULTS   Recent Results (from the past 240 hour(s))  Resp Panel by RT-PCR (Flu A&B, Covid) Nasopharyngeal Swab     Status: Abnormal   Collection Time: 09/08/2020  4:22 PM   Specimen: Nasopharyngeal Swab; Nasopharyngeal(NP) swabs in vial transport medium  Result Value Ref Range Status   SARS Coronavirus 2 by RT PCR POSITIVE (A) NEGATIVE Final    Comment: RESULT CALLED TO, READ BACK BY AND VERIFIED WITH: ASHTON PETERS AT 1743 08/26/2020.PMF (NOTE) SARS-CoV-2 target nucleic acids are DETECTED.  The SARS-CoV-2 RNA is generally detectable in upper respiratory specimens during the acute phase of infection. Positive results are indicative of the presence of the identified virus, but do not rule out bacterial infection or co-infection with other pathogens not detected by the test. Clinical correlation with patient history and other diagnostic information is necessary to determine patient infection status. The expected result is Negative.  Fact Sheet for Patients: BloggerCourse.com  Fact Sheet for Healthcare Providers: SeriousBroker.it  This test is not yet approved or cleared by the [place] FDA and  has been authorized for detection and/or diagnosis of SARS-CoV-2 by FDA under an Emergency Use Authorization (EUA).  This EUA will remain in effect (meaning this test can be  used) for the duration of  the COVID-19 declaration under Section 564(b)(1) of the Act, 21 U.S.C. section 360bbb-3(b)(1), unless the authorization  is terminated or revoked sooner.     Influenza A by PCR NEGATIVE NEGATIVE Final   Influenza B by PCR NEGATIVE NEGATIVE Final    Comment: (NOTE) The Xpert Xpress SARS-CoV-2/FLU/RSV plus assay is intended as an aid in the diagnosis of influenza from Nasopharyngeal swab specimens and should not be used as a sole basis for treatment. Nasal washings and aspirates are unacceptable for Xpert Xpress SARS-CoV-2/FLU/RSV testing.  Fact Sheet for Patients: BloggerCourse.com  Fact Sheet for Healthcare Providers: SeriousBroker.it  This test is not yet approved or cleared by the [place] FDA and has been authorized for detection and/or diagnosis of SARS-CoV-2 by FDA under an Emergency Use Authorization (EUA). This EUA will remain in effect (meaning this test can be used) for the duration of the COVID-19 declaration under Section 564(b)(1) of the Act, 21 U.S.C. section 360bbb-3(b)(1), unless the authorization is terminated or revoked.  Performed at [facility], [address] [place], [place] [address]   Culture, blood (single)     Status: None (Preliminary result)   Collection Time: 08/27/2020  5:21 PM   Specimen: BLOOD  Result Value Ref Range Status   Specimen Description BLOOD RIGHT SUB CLAV  Final   Special Requests   Final    BOTTLES DRAWN AEROBIC AND ANAEROBIC Blood Culture adequate volume   Culture   Final    NO GROWTH < 12 HOURS Performed at [facility], [address] [place], [place] [address]    Report Status PENDING  Incomplete  MRSA PCR Screening     Status: None   Collection Time: 08/29/20  4:09 AM   Specimen: Nasopharyngeal  Result Value Ref Range Status   MRSA by PCR NEGATIVE NEGATIVE Final    Comment:        The GeneXpert MRSA Assay (FDA approved for NASAL specimens only), is one component of a comprehensive MRSA colonization surveillance program. It is not intended to diagnose  MRSA infection nor to guide or monitor treatment for MRSA infections. Performed at [facility], [address] [place], [place] [address]           IMAGING    DG Abd 1 View  Result Date: 08/29/2020 CLINICAL DATA:  Acute respiratory failure.  Enteric tube EXAM: ABDOMEN - 1 VIEW COMPARISON:  None similar FINDINGS: The enteric tube tip is at the distal stomach. The bowel gas pattern is nonobstructive. Extensive artifact from hardware. Bilateral airspace disease. IMPRESSION: Enteric tube with tip at the distal stomach. Electronically Signed   By: Monte Fantasia M.D.   On: 08/29/2020 05:58   CT Head Wo Contrast  Result Date: 09/09/2020 CLINICAL DATA:  Mental status change.  Unresponsive. EXAM: CT HEAD WITHOUT CONTRAST TECHNIQUE: Contiguous axial images were obtained from the base of the skull through the vertex without intravenous contrast. COMPARISON:  CT head 10/09/2013. FINDINGS: Brain: Cerebral ventricle sizes are concordant with the degree of cerebral volume loss. Patchy and confluent areas of decreased attenuation are noted throughout the deep and periventricular white matter of the cerebral hemispheres bilaterally, compatible with chronic microvascular ischemic disease. No evidence of large-territorial acute infarction. No parenchymal hemorrhage. No mass lesion. No extra-axial collection. No mass effect or midline shift. No hydrocephalus. Basilar cisterns are patent. Vascular: No hyperdense vessel. Atherosclerotic calcifications are present within the cavernous internal carotid arteries. Skull: No acute fracture or focal lesion. Sinuses/Orbits: Right frontal and ethmoid sinus mucosal thickening. Otherwise remaining paranasal sinuses and mastoid air cells are clear. The orbits are unremarkable. Other: None. IMPRESSION: No acute intracranial abnormality. Electronically Signed   By: Iven Finn M.D.   On: 09/17/2020 18:23   DG CHEST PORT 1 VIEW  Result Date: 08/29/2020 CLINICAL  DATA:  Acute respiratory failure EXAM: PORTABLE CHEST 1 VIEW COMPARISON:  Yesterday FINDINGS: Endotracheal tube with tip at the clavicular heads. Left subclavian line with tip at the upper right atrium. The enteric tube tip is at the distal stomach. Streaky and hazy bilateral pulmonary opacity which is greater on the right. Aeration is improved from yesterday. Normal heart size. No visible effusion or air leak. IMPRESSION: 1. Stable hardware positioning. 2. Multifocal pneumonia with mildly improved lung volumes. Electronically Signed   By: Monte Fantasia M.D.   On: 08/29/2020 05:47   DG Chest Portable 1 View  Result Date: 08/27/2020 CLINICAL DATA:  Status post subclavian line placement. EXAM: PORTABLE CHEST 1 VIEW COMPARISON:  August 28, 2020 FINDINGS: The ETT is in good position. The NG tube terminates in the distal gastric body. A new left central line has been placed terminating near the caval  atrial junction. It is possible that this line extends just into the right side of the atrium. The patient may benefit from withdrawing 1.5 cm. Persistent infiltrate in the right lung. Persistent mild opacity in left base. No pneumothorax. No other changes. IMPRESSION: 1. The new left central line distal tip is near the caval atrial junction. It is possible extends just into the right side of the atrium. The patient may benefit from withdrawing 1.5 cm. No pneumothorax. 2. Other support apparatus as above. 3. Persistent infiltrates in the right mid and lower lung. Persistent mild opacity in left base. Electronically Signed   By: Dorise Bullion III M.D   On: 09/07/2020 18:03   DG Chest Portable 1 View  Result Date: 09/01/2020 CLINICAL DATA:  Status post intubation.  Patient unresponsive. EXAM: PORTABLE CHEST 1 VIEW COMPARISON:  August 05, 2020 FINDINGS: The ETT is in good position. The OG tube terminates below today's film. Infiltrate is seen in the right mid lower lung. Mild opacity in left base could represent  infiltrate or atelectasis. The cardiomediastinal silhouette is unremarkable. No pneumothorax. No other acute abnormalities. IMPRESSION: 1. Support apparatus as above.  The ETT is in good position. 2. Infiltrate in the right mid lower lung worrisome for pneumonia or aspiration. Recommend clinical correlation. More mild opacity in left base could represent atelectasis or developing infiltrate. Electronically Signed   By: Dorise Bullion III M.D   On: 09/04/2020 16:39     Nutrition Status:           Indwelling Urinary Catheter continued, requirement due to   Reason to continue Indwelling Urinary Catheter strict Intake/Output monitoring for hemodynamic instability   Central Line/ continued, requirement due to  Reason to continue [place] of central venous pressure or other hemodynamic parameters and poor IV access   Ventilator continued, requirement due to severe respiratory failure   Ventilator Sedation RASS 0 to -2      ASSESSMENT AND PLAN SYNOPSIS  87 yo female with multiorgan failure with severe acute hypoxic and hypercapnic resp failure with acute toxic metabolic encephalopathy with acute renal failure complicated by DKA   Acute hypoxemic respiratory failure due to COVID-19 pneumonia  Mechanical ventilation via ARDS protocol, target PRVC 6 cc/kg Wean PEEP and FiO2 as able Goal plateau pressure less than 30, driving pressure less than 15 Paralytics if necessary for vent synchrony, gas exchange Cycle prone positioning if necessary for oxygenation Deep sedation per PAD protocol, goal RASS -4,  Diuresis as blood pressure and renal function can tolerate, goal CVP 5-8.   diuresis as tolerated based on Kidney function VAP prevention order set Remdesivir  IV STEROIDS  Follow CRP  Severe ACUTE Hypoxic and Hypercapnic Respiratory Failure -continue Full MV support -continue Bronchodilator Therapy -Wean Fio2 and PEEP as tolerated -will perform SAT/SBT when  respiratory parameters are met -VAP/VENT bundle implementation   ACUTE KIDNEY INJURY/Renal Failure -continue Foley Catheter-assess need -Avoid nephrotoxic agents -Follow urine output, BMP -Ensure adequate renal perfusion, optimize oxygenation -Renal dose medications     NEUROLOGY Acute toxic metabolic encephalopathy, need for sedation Goal RASS -2 to -3 Consider MRI brain if remains obtunded   CARDIAC ICU monitoring  ID -continue IV abx as prescibed -follow up cultures  GI GI PROPHYLAXIS as indicated   DIET-->TF's as tolerated Constipation protocol as indicated  ENDO - will use ICU hypoglycemic\Hyperglycemia protocol if indicated     ELECTROLYTES -follow labs as needed -replace as needed -pharmacy consultation and following   DVT/GI PRX  ordered and assessed TRANSFUSIONS AS NEEDED MONITOR FSBS I Assessed the need for Labs I Assessed the need for Foley I Assessed the need for Central Venous Line Family Discussion when available I Assessed the need for Mobilization I made an Assessment of medications to be adjusted accordingly Safety Risk assessment completed   CASE DISCUSSED IN MULTIDISCIPLINARY ROUNDS WITH ICU TEAM  Critical Care Time devoted to patient care services described in this note is 65 minutes.   Overall, patient is critically ill, prognosis is guarded.  Patient with Multiorgan failure and at high risk for cardiac arrest and death.   Recommend DNR status and Palliative care consultation  Corrin Parker, M.D.  [facility]  Medical Director [place] Director [facility] Cardio-Pulmonary Department

## 2020-08-29 NOTE — Progress Notes (Signed)
GOALS OF CARE DISCUSSION  The Clinical status was relayed to family in detail. Son Gwyndolyn Saxon over the phone  Updated and notified of patients medical condition.  Patient remains unresponsive and will not open eyes to command.   Upon assessment his breath sounds are course crackles with significant secretions to oral pharyngeal region.   patient with increased WOB and using accessory muscles to breathe Explained to family course of therapy and the modalities     Patient with Progressive multiorgan failure with very low chance of meaningful recovery despite all aggressive and optimal medical therapy. Patient is in the Dying  Process associated with Suffering.  Family understands the situation.  They have consented and agreed to DNR/DNI  We will implement end of life policy for visitor policy  Family are satisfied with Plan of action and management. All questions answered  Additional CC time 32 mins   Lilianna Case Patricia Pesa, M.D.  [facility]  Medical Director [place] Director [facility] Cardio-Pulmonary Department

## 2020-08-29 NOTE — ED Notes (Signed)
Glucometers battery is dead. Pt is being transported by this RN and RT. Pt on cardiac, bp and pulse ox monitor. Pt belongings going up with pt.

## 2020-08-30 ENCOUNTER — Inpatient Hospital Stay: Admit: 2020-08-30 | Payer: Medicare Other

## 2020-08-30 DIAGNOSIS — J9601 Acute respiratory failure with hypoxia: Secondary | ICD-10-CM | POA: Diagnosis not present

## 2020-08-30 DIAGNOSIS — U071 COVID-19: Secondary | ICD-10-CM | POA: Diagnosis not present

## 2020-08-30 LAB — GLUCOSE, CAPILLARY
Glucose-Capillary: 163 mg/dL — ABNORMAL HIGH (ref 70–99)
Glucose-Capillary: 131 mg/dL — ABNORMAL HIGH (ref 70–99)
Glucose-Capillary: 140 mg/dL — ABNORMAL HIGH (ref 70–99)
Glucose-Capillary: 204 mg/dL — ABNORMAL HIGH (ref 70–99)

## 2020-08-30 MED ORDER — MIDAZOLAM HCL 2 MG/2ML IJ SOLN: 2.0000 mg | INTRAMUSCULAR | Status: DC | PRN

## 2020-08-30 MED ORDER — MORPHINE BOLUS VIA INFUSION
5.0000 mg | INTRAVENOUS | Status: DC | PRN
  Administered 2020-08-30 (×2): 5 mg via INTRAVENOUS
  Filled 2020-08-30: qty 5

## 2020-08-30 MED ORDER — DEXTROSE 5 % IV SOLN: INTRAVENOUS | Status: DC

## 2020-08-30 MED ORDER — ACETAMINOPHEN 650 MG RE SUPP: 650.0000 mg | Freq: Four times a day (QID) | RECTAL | Status: DC | PRN

## 2020-08-30 MED ORDER — GLYCOPYRROLATE 0.2 MG/ML IJ SOLN: 0.2000 mg | INTRAMUSCULAR | Status: DC | PRN

## 2020-08-30 MED ORDER — GLYCOPYRROLATE 1 MG PO TABS
1.0000 mg | ORAL_TABLET | ORAL | Status: DC | PRN
  Filled 2020-08-30: qty 1

## 2020-08-30 MED ORDER — MORPHINE SULFATE (PF) 2 MG/ML IV SOLN: 2.0000 mg | INTRAVENOUS | Status: DC | PRN

## 2020-08-30 MED ORDER — DIPHENHYDRAMINE HCL 50 MG/ML IJ SOLN: 25.0000 mg | INTRAMUSCULAR | Status: DC | PRN

## 2020-08-30 MED ORDER — ACETAMINOPHEN 325 MG PO TABS: 650.0000 mg | ORAL_TABLET | Freq: Four times a day (QID) | ORAL | Status: DC | PRN

## 2020-08-30 MED ORDER — MORPHINE 100MG IN NS 100ML (1MG/ML) PREMIX INFUSION
0.0000 mg/h | INTRAVENOUS | Status: DC
  Administered 2020-08-30: 15 mg/h via INTRAVENOUS
  Administered 2020-08-30: 5 mg/h via INTRAVENOUS
  Filled 2020-08-30 (×2): qty 100

## 2020-08-30 MED ORDER — ORAL CARE MOUTH RINSE: 15.0000 mL | OROMUCOSAL | Status: DC | PRN

## 2020-08-30 MED ORDER — GLYCOPYRROLATE 0.2 MG/ML IJ SOLN
0.2000 mg | INTRAMUSCULAR | Status: DC | PRN
  Administered 2020-08-30: 0.2 mg via INTRAVENOUS
  Filled 2020-08-30: qty 1

## 2020-08-30 MED ORDER — POLYVINYL ALCOHOL 1.4 % OP SOLN
1.0000 [drp] | Freq: Four times a day (QID) | OPHTHALMIC | Status: DC | PRN
  Filled 2020-08-30: qty 15

## 2020-08-30 NOTE — Progress Notes (Signed)
GOALS OF CARE DISCUSSION  The Clinical status was relayed to family in detail. Son Gwyndolyn Saxon  Updated and notified of patients medical condition.  Patient remains unresponsive and will not open eyes to command.   Upon assessment his breath sounds are course crackles with  Patient is having a weak cough and struggling to remove secretions.   patient with increased WOB and using accessory muscles to breathe Explained to family course of therapy and the modalities     Patient with Progressive multiorgan failure with very low chance of meaningful recovery despite all aggressive and optimal medical therapy. Patient is in the Dying  Process associated with Suffering.  Family understands the situation.  They have consented and agreed to DNR/DNI and would like to proceed with Comfort care measures.  Plan for COMFORT measures at end of the day.  Family are satisfied with Plan of action and management. All questions answered  Additional CC time 32 mins   Akesha Uresti Patricia Pesa, M.D.  [facility]  Medical Director [place] Director [facility] Cardio-Pulmonary Department

## 2020-08-30 NOTE — Progress Notes (Signed)
Family was called and asked if they are going to be present when withdrawal initiates. Son, Debbie Singleton declined and stated "go ahead, do it". Pt was suctioned, extubated on RA. RR increased, some discomfort noted. Morphine drip titrated as ordered. RN at the bedside. Emotional and spiritual support is given.

## 2020-08-30 NOTE — Progress Notes (Signed)
   08/30/20 1800  Clinical Encounter Type  Visited With Patient  Visit Type Initial  Referral From Nurse  Consult/Referral To Chaplain  Chaplain responded to a page to go to ICU. When she arrived unit secretary explained the Pt was put on comfort care and she asked that chaplain would pray for Pt. Chaplain was willing to put on PPE and go in, but was told she could pray outside the door. Chaplain prayed outside Pt's door and left.

## 2020-08-30 NOTE — Progress Notes (Signed)
CRITICAL CARE NOTE 87 yod F COVID + admitted with altered mental status requiring emergent intubation and mechanical ventilation found to be in DKA. -presents from home unresponsive via EMS.EMS reported to the Patient was intubated in the ED for airway protection in the setting of Covid pneumonia and diabetic ketoacidosis. Recent admission 08/05/2020-08/07/2020 for urinary tract infection&asymptomatic Covid infection. Past Medical History:  Hypertension Diabetes mellituspoorly controlled Breast cancer with left mastectomy- 2009 Legally blind 85 to 87 years  Significant Hospital Events:  08/29/19-unresponsive in ED>intubated for airway protection>admit to ICU in DKA 01/29/2022ETT>> 08/29/2020 CVC 3L >> 1/8 severe respiratory failure, remains Full CODE 1/9 plan for comfort care  Significant Diagnostic Tests:  01/22/2022CT headwo contrast >>no acute intracranial abnormality  Micro Data:  09/06/2020 COVID-19 >> positive 08/27/2020 Influenza A/B >> negative 09/15/2020 BC x 1 >> 08/29/20 Tracheal aspirate >>  Antimicrobials:  01/05/2022azithromycin>> 1/7/22ceftriaxone>>     CC  follow up respiratory failure  SUBJECTIVE Patient remains critically ill Prognosis is guarded   BP 123/60   Pulse 81   Temp 98.1 F (36.7 C)   Resp (!) 21   Ht 5' (1.524 m)   Wt 63.3 kg   SpO2 100%   BMI 27.25 kg/m    I/O last 3 completed shifts: In: 5270.7 [I.V.:3829.7; IV Piggyback:1440.9] Out: 695 [Urine:695] No intake/output data recorded.  SpO2: 100 % FiO2 (%): 40 %  Estimated body mass index is 27.25 kg/m as calculated from the following:   Height as of this encounter: 5' (1.524 m).   Weight as of this encounter: 63.3 kg.  SIGNIFICANT EVENTS   REVIEW OF SYSTEMS  PATIENT IS UNABLE TO PROVIDE COMPLETE REVIEW OF SYSTEMS DUE TO SEVERE CRITICAL ILLNESS   Pressure Injury 08/29/20 Sacrum Medial Stage 2 -  Partial thickness loss of dermis presenting as a shallow open injury with a red,  pink wound bed without slough. 1cm circular (Active)  08/29/20 0200  Location: Sacrum  Location Orientation: Medial  Staging: Stage 2 -  Partial thickness loss of dermis presenting as a shallow open injury with a red, pink wound bed without slough.  Wound Description (Comments): 1cm circular  Present on Admission: Yes    COVID-19 DISASTER DECLARATION:   FULL CONTACT PHYSICAL EXAMINATION WAS NOT POSSIBLE DUE TO TREATMENT OF COVID-19  AND CONSERVATION OF PERSONAL PROTECTIVE EQUIPMENT, LIMITED EXAM FINDINGS INCLUDE-   PHYSICAL EXAMINATION:  GENERAL:critically ill appearing, +resp distress NEUROLOGIC: obtunded, GCS<8   Patient assessed or the symptoms described in the history of present illness.  In the context of the Global COVID-19 pandemic, which necessitated consideration that the patient might be at risk for infection with the SARS-CoV-2 virus that causes COVID-19, Institutional protocols and algorithms that pertain to the evaluation of patients at risk for COVID-19 are in a state of rapid change based on information released by regulatory bodies including the CDC and federal and state organizations. These policies and algorithms were followed during the patient's care while in hospital.    MEDICATIONS: I have reviewed all medications and confirmed regimen as documented   CULTURE RESULTS   Recent Results (from the past 240 hour(s))  Resp Panel by RT-PCR (Flu A&B, Covid) Nasopharyngeal Swab     Status: Abnormal   Collection Time: 08/28/20  4:22 PM   Specimen: Nasopharyngeal Swab; Nasopharyngeal(NP) swabs in vial transport medium  Result Value Ref Range Status   SARS Coronavirus 2 by RT PCR POSITIVE (A) NEGATIVE Final    Comment: RESULT CALLED TO, READ BACK BY AND  VERIFIED WITH: ASHTON PETERS AT 1743 09/05/2020.PMF (NOTE) SARS-CoV-2 target nucleic acids are DETECTED.  The SARS-CoV-2 RNA is generally detectable in upper respiratory specimens during the acute phase of infection.  Positive results are indicative of the presence of the identified virus, but do not rule out bacterial infection or co-infection with other pathogens not detected by the test. Clinical correlation with patient history and other diagnostic information is necessary to determine patient infection status. The expected result is Negative.  Fact Sheet for Patients: EntrepreneurPulse.com.au  Fact Sheet for Healthcare Providers: IncredibleEmployment.be  This test is not yet approved or cleared by the [place] FDA and  has been authorized for detection and/or diagnosis of SARS-CoV-2 by FDA under an Emergency Use Authorization (EUA).  This EUA will remain in effect (meaning this test can be  used) for the duration of  the COVID-19 declaration under Section 564(b)(1) of the Act, 21 U.S.C. section 360bbb-3(b)(1), unless the authorization is terminated or revoked sooner.     Influenza A by PCR NEGATIVE NEGATIVE Final   Influenza B by PCR NEGATIVE NEGATIVE Final    Comment: (NOTE) The Xpert Xpress SARS-CoV-2/FLU/RSV plus assay is intended as an aid in the diagnosis of influenza from Nasopharyngeal swab specimens and should not be used as a sole basis for treatment. Nasal washings and aspirates are unacceptable for Xpert Xpress SARS-CoV-2/FLU/RSV testing.  Fact Sheet for Patients: EntrepreneurPulse.com.au  Fact Sheet for Healthcare Providers: IncredibleEmployment.be  This test is not yet approved or cleared by the [place] FDA and has been authorized for detection and/or diagnosis of SARS-CoV-2 by FDA under an Emergency Use Authorization (EUA). This EUA will remain in effect (meaning this test can be used) for the duration of the COVID-19 declaration under Section 564(b)(1) of the Act, 21 U.S.C. section 360bbb-3(b)(1), unless the authorization is terminated or revoked.  Performed at [facility],  [address] [place], [place] [address]   Culture, blood (single)     Status: None (Preliminary result)   Collection Time: 08/25/2020  5:21 PM   Specimen: BLOOD  Result Value Ref Range Status   Specimen Description BLOOD RIGHT SUB CLAV  Final   Special Requests   Final    BOTTLES DRAWN AEROBIC AND ANAEROBIC Blood Culture adequate volume   Culture   Final    NO GROWTH 2 DAYS Performed at [facility], [address] [place], [place] [address]    Report Status PENDING  Incomplete  MRSA PCR Screening     Status: None   Collection Time: 08/29/20  4:09 AM   Specimen: Nasopharyngeal  Result Value Ref Range Status   MRSA by PCR NEGATIVE NEGATIVE Final    Comment:        The GeneXpert MRSA Assay (FDA approved for NASAL specimens only), is one component of a comprehensive MRSA colonization surveillance program. It is not intended to diagnose MRSA infection nor to guide or monitor treatment for MRSA infections. Performed at [facility], [address] [place], [place] [address]             Indwelling Urinary Catheter continued, requirement due to   Reason to continue Indwelling Urinary Catheter strict Intake/Output monitoring for hemodynamic instability         Ventilator continued, requirement due to severe respiratory failure   Ventilator Sedation RASS 0 to -2      ASSESSMENT AND PLAN SYNOPSIS  87 yo female with multiorgan failure with severe acute hypoxic and hypercapnic resp failure with  acute toxic metabolic encephalopathy with acute renal failure complicated by DKA   Acute hypoxemic respiratory failure due to COVID-19 pneumonia  Patient is suffering and dying process   Severe ACUTE Hypoxic and Hypercapnic Respiratory Failure -continue Full MV support -continue Bronchodilator Therapy -Wean Fio2 and PEEP as tolerated -VAP/VENT bundle implementation  ACUTE KIDNEY INJURY/Renal Failure -continue Foley Catheter-assess need -Avoid  nephrotoxic agents -Follow urine output, BMP -Ensure adequate renal perfusion, optimize oxygenation -Renal dose medications     NEUROLOGY Acute toxic metabolic encephalopathy, need for sedation Goal RASS -2 to -3  CARDIAC ICU monitoring  ID -continue IV abx as prescibed -follow up cultures  GI GI PROPHYLAXIS as indicated   Constipation protocol as indicated  ENDO - will use ICU hypoglycemic\Hyperglycemia protocol if indicated     ELECTROLYTES -follow labs as needed -replace as needed -pharmacy consultation and following   DVT/GI PRX ordered and assessed TRANSFUSIONS AS NEEDED MONITOR FSBS I Assessed the need for Labs I Assessed the need for Foley I Assessed the need for Central Venous Line Family Discussion when available I Assessed the need for Mobilization I made an Assessment of medications to be adjusted accordingly Safety Risk assessment completed   CASE DISCUSSED IN MULTIDISCIPLINARY ROUNDS WITH ICU TEAM  Critical Care Time devoted to patient care services described in this note is 56 minutes.   Overall, patient is critically ill, prognosis is guarded.  Patient with Multiorgan failure and at high risk for cardiac arrest and death.   DNR status, plan for comfort care measures  Debbie Singleton Patricia Pesa, M.D.  [facility]  Medical Director [place] [facility] Cardio-Pulmonary Department

## 2020-08-30 NOTE — Care Plan (Signed)
Patient is extubated to room air per order 

## 2020-09-01 LAB — BLOOD CULTURE ID PANEL (REFLEXED) - BCID2

## 2020-09-01 LAB — CULTURE, RESPIRATORY W GRAM STAIN

## 2020-09-01 LAB — INTERLEUKIN-6, SERUM  IL6SL: Interleukin-6, Serum: 8.4 pg/mL (ref 0.0–13.0)

## 2020-09-03 LAB — CULTURE, BLOOD (SINGLE): Special Requests: ADEQUATE

## 2020-09-22 NOTE — Death Summary Note (Addendum)
DEATH SUMMARY   Patient Details  Name: Debbie Singleton MRN: [id number] DOB: 1924-10-27  Admission/Discharge Information   Admit Date:  31-Aug-2020  Date of Death:  09-03-20  Time of Death:  00:44  Length of Stay: 3  Referring Physician: Perrin Maltese, MD   Reason(s) for Hospitalization  Unresponsive with diabetic ketoacidosis & COVID-19 pneumonia.  Diagnoses  Preliminary cause of death:   Secondary Diagnoses (including complications and co-morbidities):  Active Problems:   COVID-19   Brief Hospital Course (including significant findings, care, treatment, and services provided and events leading to death)  Debbie Singleton is a 87 y.o. year old female who was found at home unresponsive via EMS. EMS reported that her blood glucose was reading "high" on the scene. 500 mL bolus given in the field. ED provider spoke with her son, Debbie Singleton, confirming that the patient is a full code and that he wanted to move forward with all interventions. Patient was intubated in the ED for airway protection in the setting of COVID pneumonia and diabetic ketoacidosis. Recent admission 08/05/20- 08/07/20 for UTI & asymptomatic COVID infection. Patient was admitted to the ICU on the ventilator receiving treatment for DKA. CT of the head showed no acute intracranial abnormality. Patient's right middle lobe pneumonia was treated with IV antibiotics, azithromycin & ceftriaxone. Patient was not improving and a goals of care discussion was held with family on 08/29/2020 at which time the patient's CODE STATUS was changed to DNR/DNI. On 08/30/2020, as the patient continued to decline the family was consulted and comfort measures were initiated including terminal extubation on 08/30/20 at 17:30. Patient was pronounced deceased at 00:44, no family present at this time- but had received visitors during the previous day. Alerted by care RN of passing.  Pertinent Labs and Studies  Significant Diagnostic Studies DG Wrist  Complete Left  Result Date: 08/05/2020 CLINICAL DATA:  "Patient with swelling in the area. Reportedly she fractured it several weeks ago but will not wear the brace want to make sure there is nothing bad going on there and leg badly misaligned fracture" EXAM: LEFT WRIST - COMPLETE 3+ VIEW COMPARISON:  No prior exams available. FINDINGS: Subacute impacted distal radius fracture primarily involving the metaphysis. There is some peripheral callus formation. Margins are sclerotic. Probable healing ulna styloid fracture. No evidence of acute fracture. There is slight offset of the distal radioulnar joint. Generalized soft tissue edema about the wrist. IMPRESSION: 1. Subacute impacted distal radius fracture. Margins are sclerotic with some peripheral incomplete callus formation. 2. Probable healing ulna styloid fracture. 3. Slight offset of the distal radioulnar joint, of unknown acuity. Electronically Signed   By: Keith Rake M.D.   On: 08/05/2020 17:18   DG Abd 1 View  Result Date: 08/29/2020 CLINICAL DATA:  Acute respiratory failure.  Enteric tube EXAM: ABDOMEN - 1 VIEW COMPARISON:  None similar FINDINGS: The enteric tube tip is at the distal stomach. The bowel gas pattern is nonobstructive. Extensive artifact from hardware. Bilateral airspace disease. IMPRESSION: Enteric tube with tip at the distal stomach. Electronically Signed   By: Monte Fantasia M.D.   On: 08/29/2020 05:58   CT Head Wo Contrast  Result Date: 09/08/2020 CLINICAL DATA:  Mental status change.  Unresponsive. EXAM: CT HEAD WITHOUT CONTRAST TECHNIQUE: Contiguous axial images were obtained from the base of the skull through the vertex without intravenous contrast. COMPARISON:  CT head 10/09/2013. FINDINGS: Brain: Cerebral ventricle sizes are concordant with the degree of cerebral volume loss. Patchy  and confluent areas of decreased attenuation are noted throughout the deep and periventricular white matter of the cerebral hemispheres  bilaterally, compatible with chronic microvascular ischemic disease. No evidence of large-territorial acute infarction. No parenchymal hemorrhage. No mass lesion. No extra-axial collection. No mass effect or midline shift. No hydrocephalus. Basilar cisterns are patent. Vascular: No hyperdense vessel. Atherosclerotic calcifications are present within the cavernous internal carotid arteries. Skull: No acute fracture or focal lesion. Sinuses/Orbits: Right frontal and ethmoid sinus mucosal thickening. Otherwise remaining paranasal sinuses and mastoid air cells are clear. The orbits are unremarkable. Other: None. IMPRESSION: No acute intracranial abnormality. Electronically Signed   By: Iven Finn M.D.   On: 08/27/2020 18:23   DG CHEST PORT 1 VIEW  Result Date: 08/29/2020 CLINICAL DATA:  Acute respiratory failure EXAM: PORTABLE CHEST 1 VIEW COMPARISON:  Yesterday FINDINGS: Endotracheal tube with tip at the clavicular heads. Left subclavian line with tip at the upper right atrium. The enteric tube tip is at the distal stomach. Streaky and hazy bilateral pulmonary opacity which is greater on the right. Aeration is improved from yesterday. Normal heart size. No visible effusion or air leak. IMPRESSION: 1. Stable hardware positioning. 2. Multifocal pneumonia with mildly improved lung volumes. Electronically Signed   By: Monte Fantasia M.D.   On: 08/29/2020 05:47   DG Chest Portable 1 View  Result Date: 08/23/2020 CLINICAL DATA:  Status post subclavian line placement. EXAM: PORTABLE CHEST 1 VIEW COMPARISON:  August 28, 2020 FINDINGS: The ETT is in good position. The NG tube terminates in the distal gastric body. A new left central line has been placed terminating near the caval atrial junction. It is possible that this line extends just into the right side of the atrium. The patient may benefit from withdrawing 1.5 cm. Persistent infiltrate in the right lung. Persistent mild opacity in left base. No pneumothorax.  No other changes. IMPRESSION: 1. The new left central line distal tip is near the caval atrial junction. It is possible extends just into the right side of the atrium. The patient may benefit from withdrawing 1.5 cm. No pneumothorax. 2. Other support apparatus as above. 3. Persistent infiltrates in the right mid and lower lung. Persistent mild opacity in left base. Electronically Signed   By: Dorise Bullion III M.D   On: 09/15/2020 18:03   DG Chest Portable 1 View  Result Date: 08/30/2020 CLINICAL DATA:  Status post intubation.  Patient unresponsive. EXAM: PORTABLE CHEST 1 VIEW COMPARISON:  August 05, 2020 FINDINGS: The ETT is in good position. The OG tube terminates below today's film. Infiltrate is seen in the right mid lower lung. Mild opacity in left base could represent infiltrate or atelectasis. The cardiomediastinal silhouette is unremarkable. No pneumothorax. No other acute abnormalities. IMPRESSION: 1. Support apparatus as above.  The ETT is in good position. 2. Infiltrate in the right mid lower lung worrisome for pneumonia or aspiration. Recommend clinical correlation. More mild opacity in left base could represent atelectasis or developing infiltrate. Electronically Signed   By: Dorise Bullion III M.D   On: 09/09/2020 16:39   DG Chest Portable 1 View  Result Date: 08/05/2020 CLINICAL DATA:  87 year old female with bilateral lower extremity edema. Concern for CHF. EXAM: PORTABLE CHEST 1 VIEW COMPARISON:  Chest radiograph dated 09/04/2007. FINDINGS: There is no focal consolidation, pleural effusion, or pneumothorax. Top-normal cardiac size. Atherosclerotic calcification of the aorta. Osteopenia with degenerative changes of the spine and shoulders. No acute osseous pathology. IMPRESSION: No active disease. Electronically Signed  By: Anner Crete M.D.   On: 08/05/2020 17:17    Microbiology Recent Results (from the past 240 hour(s))  Resp Panel by RT-PCR (Flu A&B, Covid) Nasopharyngeal  Swab     Status: Abnormal   Collection Time: 09/06/2020  4:22 PM   Specimen: Nasopharyngeal Swab; Nasopharyngeal(NP) swabs in vial transport medium  Result Value Ref Range Status   SARS Coronavirus 2 by RT PCR POSITIVE (A) NEGATIVE Final    Comment: RESULT CALLED TO, READ BACK BY AND VERIFIED WITH: ASHTON PETERS AT [id number] 08/23/2020.PMF (NOTE) SARS-CoV-2 target nucleic acids are DETECTED.  The SARS-CoV-2 RNA is generally detectable in upper respiratory specimens during the acute phase of infection. Positive results are indicative of the presence of the identified virus, but do not rule out bacterial infection or co-infection with other pathogens not detected by the test. Clinical correlation with patient history and other diagnostic information is necessary to determine patient infection status. The expected result is Negative.  Fact Sheet for Patients: EntrepreneurPulse.com.au  Fact Sheet for Healthcare Providers: IncredibleEmployment.be  This test is not yet approved or cleared by the [place] FDA and  has been authorized for detection and/or diagnosis of SARS-CoV-2 by FDA under an Emergency Use Authorization (EUA).  This EUA will remain in effect (meaning this test can be  used) for the duration of  the COVID-19 declaration under Section 564(b)(1) of the Act, 21 U.S.C. section 360bbb-3(b)(1), unless the authorization is terminated or revoked sooner.     Influenza A by PCR NEGATIVE NEGATIVE Final   Influenza B by PCR NEGATIVE NEGATIVE Final    Comment: (NOTE) The Xpert Xpress SARS-CoV-2/FLU/RSV plus assay is intended as an aid in the diagnosis of influenza from Nasopharyngeal swab specimens and should not be used as a sole basis for treatment. Nasal washings and aspirates are unacceptable for Xpert Xpress SARS-CoV-2/FLU/RSV testing.  Fact Sheet for Patients: EntrepreneurPulse.com.au  Fact Sheet for Healthcare  Providers: IncredibleEmployment.be  This test is not yet approved or cleared by the [place] FDA and has been authorized for detection and/or diagnosis of SARS-CoV-2 by FDA under an Emergency Use Authorization (EUA). This EUA will remain in effect (meaning this test can be used) for the duration of the COVID-19 declaration under Section 564(b)(1) of the Act, 21 U.S.C. section 360bbb-3(b)(1), unless the authorization is terminated or revoked.  Performed at [facility], [address] [place], [place] [address]   Culture, blood (single)     Status: None (Preliminary result)   Collection Time: 08/24/2020  5:21 PM   Specimen: BLOOD  Result Value Ref Range Status   Specimen Description BLOOD RIGHT SUB CLAV  Final   Special Requests   Final    BOTTLES DRAWN AEROBIC AND ANAEROBIC Blood Culture adequate volume   Culture   Final    NO GROWTH 2 DAYS Performed at [facility], [address] [place], [place] [address]    Report Status PENDING  Incomplete  MRSA PCR Screening     Status: None   Collection Time: 08/29/20  4:09 AM   Specimen: Nasopharyngeal  Result Value Ref Range Status   MRSA by PCR NEGATIVE NEGATIVE Final    Comment:        The GeneXpert MRSA Assay (FDA approved for NASAL specimens only), is one component of a comprehensive MRSA colonization surveillance program. It is not intended to diagnose MRSA infection nor to guide or monitor treatment for MRSA infections. Performed at [facility], [address],  [place], [place] [address]   Culture, respiratory (non-expectorated)     Status: None (Preliminary result)   Collection Time: 08/29/20 12:48 PM   Specimen: SPU; Respiratory  Result Value Ref Range Status   Specimen Description   Final    SPUTUM Performed at [facility], [address] [place], [place] [address]    Special Requests   Final    NONE Performed at [facility], [address] [place], [place] [address]    Gram Stain   Final    FEW WBC PRESENT,BOTH PMN AND MONONUCLEAR ABUNDANT GRAM NEGATIVE RODS RARE YEAST    Culture   Final    MODERATE SERRATIA MARCESCENS SUSCEPTIBILITIES TO FOLLOW Performed at [facility], [place] [address] [place], [place] [address]    Report Status PENDING  Incomplete    Lab Basic Metabolic Panel: Recent Labs  Lab 09/12/2020 1721 08/25/2020 1850 08/29/2020 2235 08/29/20 0326  NA 150* 150*  149* 151* 151*  K 6.1* 5.9*  5.9* 4.5 4.3  CL 113* 114*  113* 115* 116*  CO2 17* 14*  16* 19* 21*  GLUCOSE 567* 545*  553* 354* 223*  BUN 69* 70*  67* 71* 70*  CREATININE 2.68* 2.58*  2.63* 2.26* 1.99*  CALCIUM 8.6* 8.2*  8.1* 8.4* 8.4*  MG 2.6*  --   --   --   PHOS  --  7.8*  --   --    Liver Function Tests: Recent Labs  Lab 08/22/2020 1721  AST 20  ALT 14  ALKPHOS 74  BILITOT 1.5*  PROT 6.9  ALBUMIN 2.3*   No results for input(s): LIPASE, AMYLASE in the last 168 hours. No results for input(s): AMMONIA in the last 168 hours. CBC: Recent Labs  Lab 09/03/2020 1721 08/29/20 0326  WBC 31.5* 31.2*  NEUTROABS 27.4*  --   HGB 13.0 12.9  HCT 43.6 41.4  MCV 96.0 92.8  PLT 350 280   Cardiac Enzymes: No results for input(s): CKTOTAL, CKMB, CKMBINDEX, TROPONINI in the last 168 hours. Sepsis Labs: Recent Labs  Lab 09/14/2020 1721 08/29/2020 1850 08/29/20 0326  PROCALCITON  --  1.94 2.45  WBC 31.5*  --  31.2*  LATICACIDVEN 1.7  --   --     Procedures/Operations  09/17/2020 ETT placed - removed on 08/30/20 09/03/2020 Subclavian central line placed     Domingo Pulse [place], AGACNP-BC Acute Care Nurse Practitioner [place]   [phone] / [phone] Please see Amion for pager details.    Victorio Creeden L Rust-[place]ster 09/26/20, 12:45 AM

## 2020-09-22 NOTE — Progress Notes (Signed)
Time of death:0044 date of death September 09, 2020, notified [place] and family. Awaiting decision on funeral home

## 2020-09-22 DEATH — deceased

## 2021-12-28 IMAGING — DX DG CHEST 1V PORT
1 series · 1 of 1 positions shown · non-contrast
Comparison: August 28, 2020

CLINICAL DATA: Status post subclavian line placement.

EXAM:
PORTABLE CHEST 1 VIEW

[chest ap]
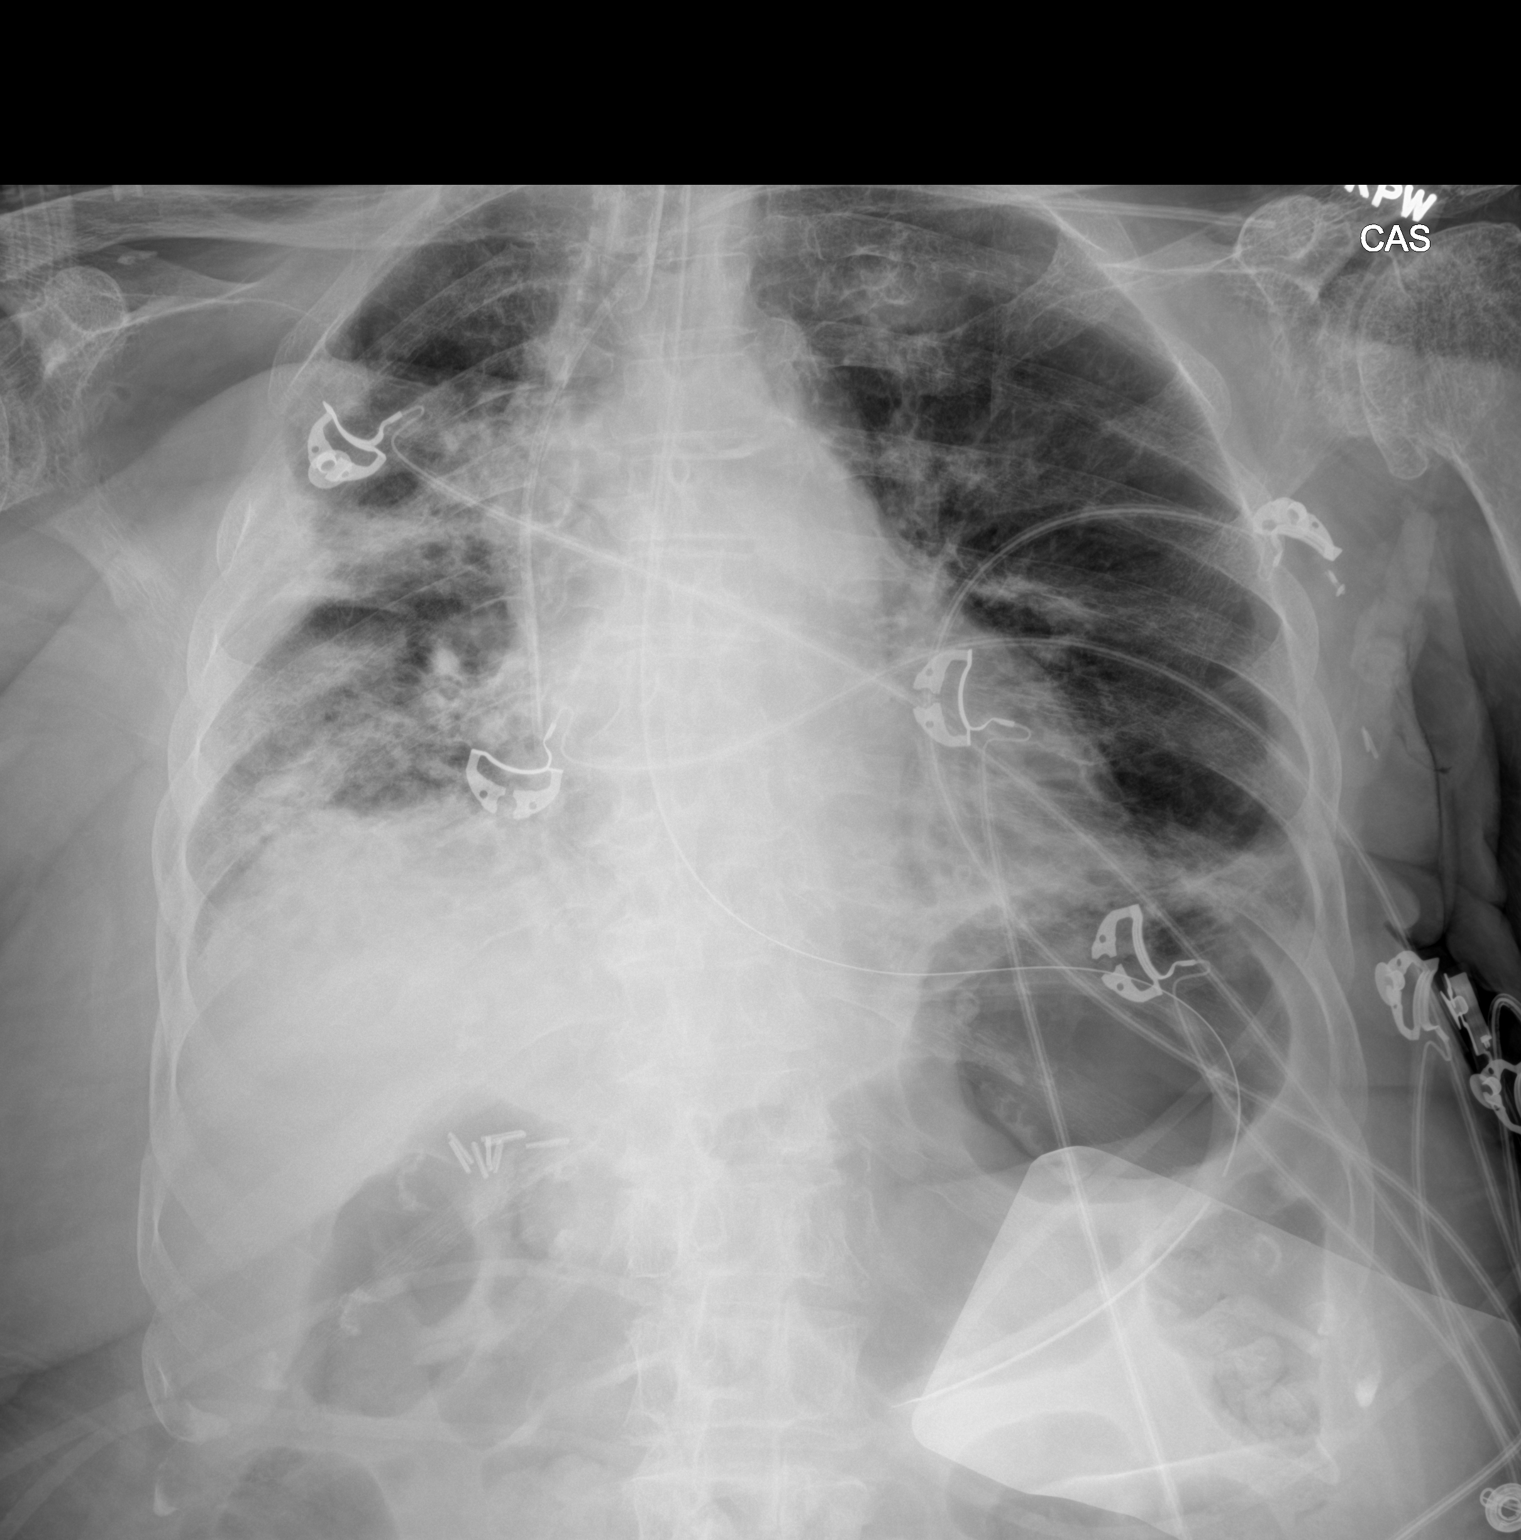

[1 of 1 positions shown; findings below may reference images not displayed]

FINDINGS: The ETT is in good position. The NG tube terminates in the distal
gastric body. A new left central line has been placed terminating
near the caval atrial junction. It is possible that this line
extends just into the right side of the atrium. The patient may
benefit from withdrawing 1.5 cm. Persistent infiltrate in the right
lung. Persistent mild opacity in left base. No pneumothorax. No
other changes.
IMPRESSION: 1. The new left central line distal tip is near the caval atrial
junction. It is possible extends just into the right side of the
atrium. The patient may benefit from withdrawing 1.5 cm. No
pneumothorax.
2. Other support apparatus as above.
3. Persistent infiltrates in the right mid and lower lung.
Persistent mild opacity in left base.

## 2021-12-29 IMAGING — DX DG CHEST 1V PORT
1 series · 1 of 1 positions shown · non-contrast
Comparison: Yesterday

CLINICAL DATA: Acute respiratory failure

EXAM:
PORTABLE CHEST 1 VIEW

[chest pa]
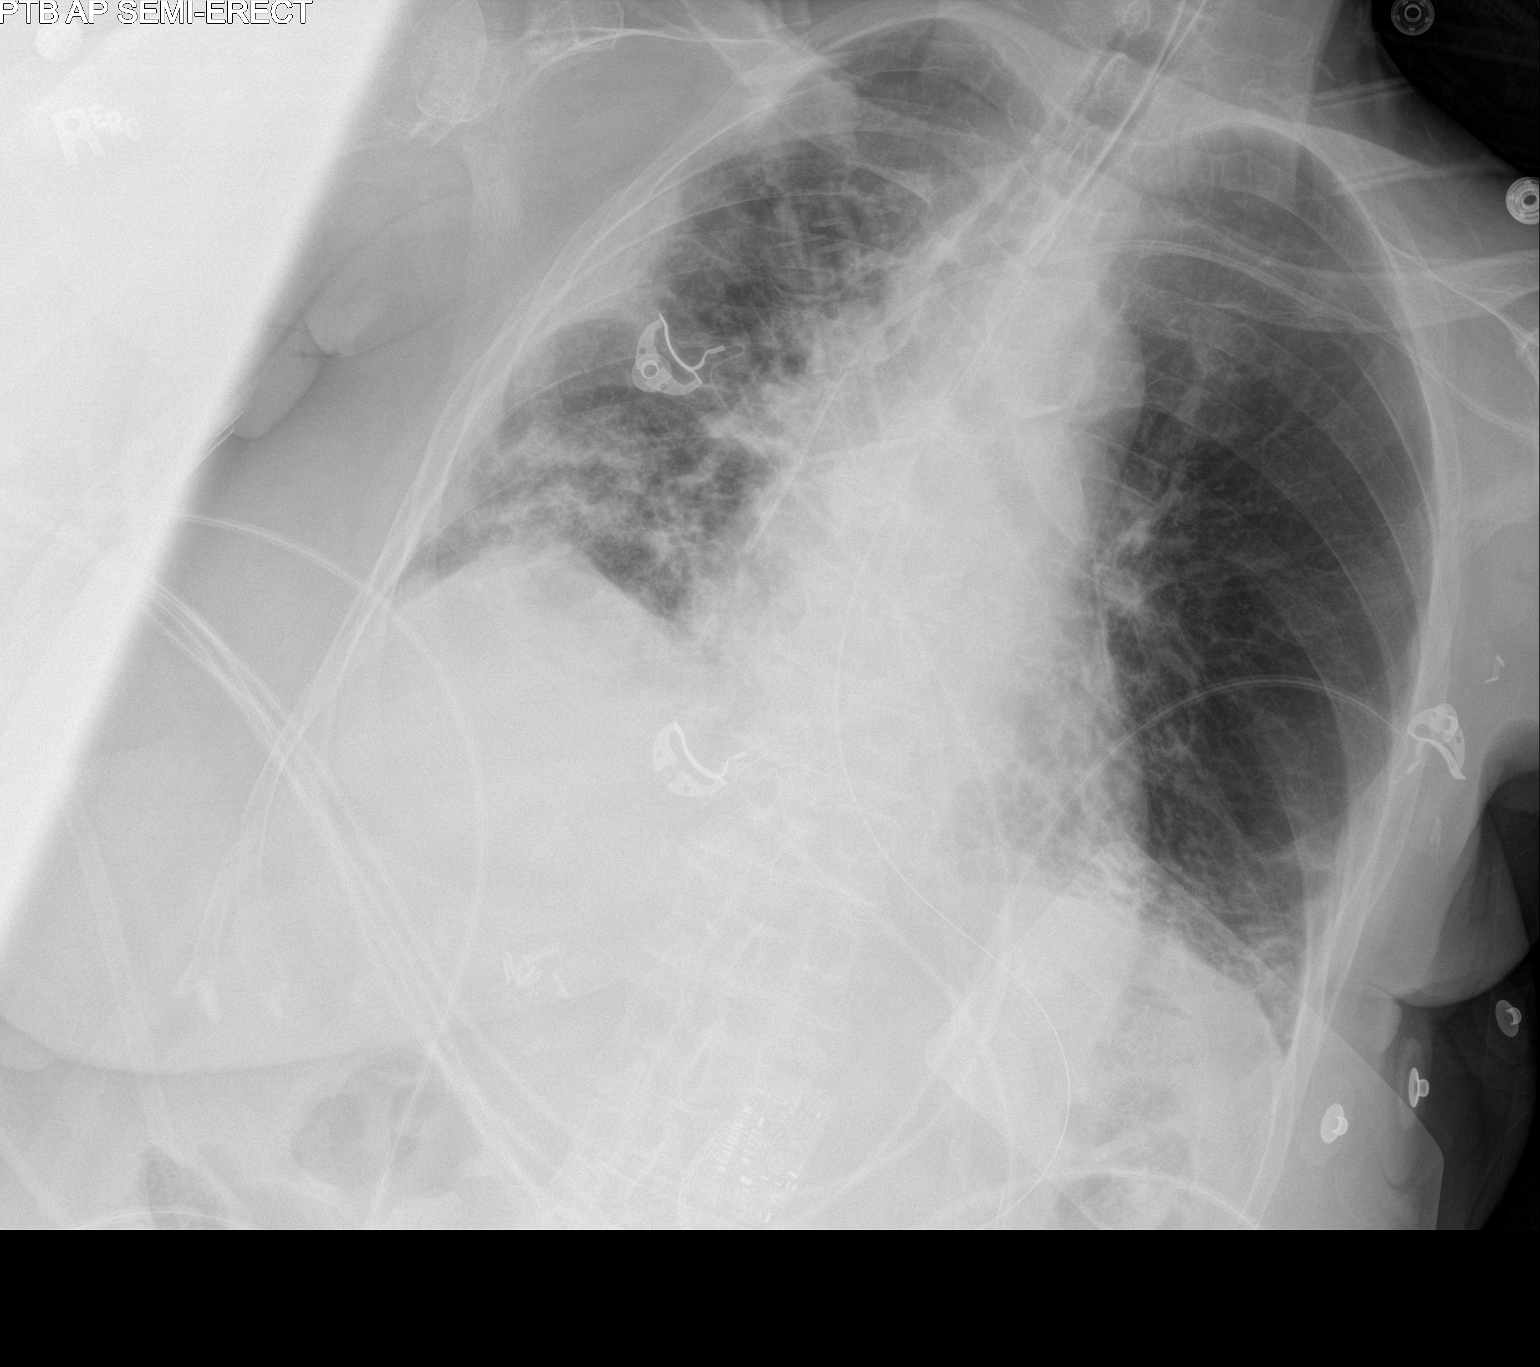

[1 of 1 positions shown; findings below may reference images not displayed]

FINDINGS: Endotracheal tube with tip at the clavicular heads. Left subclavian
line with tip at the upper right atrium. The enteric tube tip is at
the distal stomach.

Streaky and hazy bilateral pulmonary opacity which is greater on the
right. Aeration is improved from yesterday. Normal heart size. No
visible effusion or air leak.
IMPRESSION: 1. Stable hardware positioning.
2. Multifocal pneumonia with mildly improved lung volumes.
